# Patient Record
Sex: Female | Born: 1970 | Race: White | Hispanic: No | Marital: Married | Smoking: Never smoker
Health system: Southern US, Community
[De-identification: ages and names within clinical notes are randomized; demographics above are authoritative.]

## PROBLEM LIST (undated history)

## (undated) DIAGNOSIS — F419 Anxiety disorder, unspecified: Secondary | ICD-10-CM

## (undated) DIAGNOSIS — F32A Depression, unspecified: Secondary | ICD-10-CM

## (undated) DIAGNOSIS — K219 Gastro-esophageal reflux disease without esophagitis: Secondary | ICD-10-CM

## (undated) DIAGNOSIS — F329 Major depressive disorder, single episode, unspecified: Secondary | ICD-10-CM

## (undated) DIAGNOSIS — I10 Essential (primary) hypertension: Secondary | ICD-10-CM

## (undated) DIAGNOSIS — G2581 Restless legs syndrome: Secondary | ICD-10-CM

## (undated) DIAGNOSIS — G43909 Migraine, unspecified, not intractable, without status migrainosus: Secondary | ICD-10-CM

## (undated) DIAGNOSIS — N393 Stress incontinence (female) (male): Secondary | ICD-10-CM

## (undated) DIAGNOSIS — Z889 Allergy status to unspecified drugs, medicaments and biological substances status: Secondary | ICD-10-CM

## (undated) HISTORY — DX: Allergy status to unspecified drugs, medicaments and biological substances: Z88.9

## (undated) HISTORY — PX: HYSTEROSCOPY: SHX211

## (undated) HISTORY — DX: Depression, unspecified: F32.A

## (undated) HISTORY — DX: Stress incontinence (female) (male): N39.3

## (undated) HISTORY — DX: Major depressive disorder, single episode, unspecified: F32.9

## (undated) HISTORY — DX: Migraine, unspecified, not intractable, without status migrainosus: G43.909

---

## 1997-06-01 ENCOUNTER — Inpatient Hospital Stay (HOSPITAL_COMMUNITY): Admission: AD | Admit: 1997-06-01 | Discharge: 1997-06-04 | Payer: Self-pay | Admitting: Obstetrics and Gynecology

## 1997-06-02 ENCOUNTER — Encounter (HOSPITAL_COMMUNITY): Admission: RE | Admit: 1997-06-02 | Discharge: 1997-08-31 | Payer: Self-pay | Admitting: Obstetrics and Gynecology

## 1998-01-09 ENCOUNTER — Other Ambulatory Visit: Admission: RE | Admit: 1998-01-09 | Discharge: 1998-01-09 | Payer: Self-pay | Admitting: Obstetrics & Gynecology

## 1999-02-28 ENCOUNTER — Other Ambulatory Visit: Admission: RE | Admit: 1999-02-28 | Discharge: 1999-02-28 | Payer: Self-pay | Admitting: Obstetrics & Gynecology

## 2000-03-05 ENCOUNTER — Other Ambulatory Visit: Admission: RE | Admit: 2000-03-05 | Discharge: 2000-03-05 | Payer: Self-pay | Admitting: Obstetrics & Gynecology

## 2014-08-03 ENCOUNTER — Other Ambulatory Visit: Payer: Self-pay | Admitting: Family Medicine

## 2014-08-03 ENCOUNTER — Ambulatory Visit
Admission: RE | Admit: 2014-08-03 | Discharge: 2014-08-03 | Disposition: A | Discharge status: Home / Self Care | Payer: BC Managed Care – PPO | Source: Ambulatory Visit | Attending: Family Medicine | Admitting: Family Medicine

## 2014-08-03 DIAGNOSIS — R05 Cough: Secondary | ICD-10-CM

## 2014-08-03 DIAGNOSIS — R053 Chronic cough: Secondary | ICD-10-CM

## 2014-08-19 ENCOUNTER — Other Ambulatory Visit (HOSPITAL_COMMUNITY): Payer: Self-pay | Admitting: Respiratory Therapy

## 2014-08-29 ENCOUNTER — Encounter (HOSPITAL_COMMUNITY): Payer: BC Managed Care – PPO

## 2014-08-29 ENCOUNTER — Ambulatory Visit (HOSPITAL_COMMUNITY): Admission: RE | Admit: 2014-08-29 | Payer: BC Managed Care – PPO | Source: Ambulatory Visit

## 2014-08-29 ENCOUNTER — Other Ambulatory Visit (HOSPITAL_COMMUNITY): Payer: Self-pay | Admitting: Respiratory Therapy

## 2014-08-29 ENCOUNTER — Ambulatory Visit (HOSPITAL_COMMUNITY)
Admission: RE | Admit: 2014-08-29 | Discharge: 2014-08-29 | Disposition: A | Discharge status: Home / Self Care | Payer: BC Managed Care – PPO | Source: Ambulatory Visit | Attending: Family Medicine | Admitting: Family Medicine

## 2014-08-29 DIAGNOSIS — J45901 Unspecified asthma with (acute) exacerbation: Secondary | ICD-10-CM | POA: Insufficient documentation

## 2014-08-29 LAB — PULMONARY FUNCTION TEST
DL/VA % PRED: 120 %
DL/VA: 5.28 ml/min/mmHg/L
DLCO unc % pred: 104 %
DLCO unc: 21.2 ml/min/mmHg
FEF 25-75 POST: 3.15 L/s
FEF 25-75 PRE: 2.98 L/s
FEF2575-%CHANGE-POST: 5 %
FEF2575-%Pred-Post: 110 %
FEF2575-%Pred-Pre: 104 %
FEV1-%Change-Post: 0 %
FEV1-%Pred-Post: 96 %
FEV1-%Pred-Pre: 96 %
FEV1-PRE: 2.59 L
FEV1-Post: 2.59 L
FEV1FVC-%CHANGE-POST: 2 %
FEV1FVC-%PRED-PRE: 102 %
FEV6-%Change-Post: -2 %
FEV6-%PRED-PRE: 94 %
FEV6-%Pred-Post: 92 %
FEV6-PRE: 3.06 L
FEV6-Post: 2.99 L
FEV6FVC-%Change-Post: 0 %
FEV6FVC-%PRED-POST: 101 %
FEV6FVC-%Pred-Pre: 101 %
FVC-%Change-Post: -2 %
FVC-%PRED-PRE: 93 %
FVC-%Pred-Post: 90 %
FVC-Post: 3 L
FVC-Pre: 3.08 L
Post FEV1/FVC ratio: 86 %
Post FEV6/FVC ratio: 100 %
Pre FEV1/FVC ratio: 84 %
Pre FEV6/FVC Ratio: 99 %
RV % pred: 79 %
RV: 1.2 L
TLC % pred: 93 %
TLC: 4.3 L

## 2014-08-29 MED ORDER — ALBUTEROL SULFATE (2.5 MG/3ML) 0.083% IN NEBU
2.5000 mg | INHALATION_SOLUTION | Freq: Once | RESPIRATORY_TRACT | Status: AC
  Administered 2014-08-29: 2.5 mg via RESPIRATORY_TRACT

## 2014-09-19 ENCOUNTER — Other Ambulatory Visit: Payer: Self-pay | Admitting: *Deleted

## 2014-09-19 ENCOUNTER — Encounter: Payer: Self-pay | Admitting: *Deleted

## 2014-09-20 ENCOUNTER — Telehealth: Payer: Self-pay | Admitting: Pulmonary Disease

## 2014-09-20 ENCOUNTER — Encounter: Payer: Self-pay | Admitting: Pulmonary Disease

## 2014-09-20 ENCOUNTER — Other Ambulatory Visit: Payer: BC Managed Care – PPO

## 2014-09-20 ENCOUNTER — Ambulatory Visit (INDEPENDENT_AMBULATORY_CARE_PROVIDER_SITE_OTHER): Payer: BC Managed Care – PPO | Admitting: Pulmonary Disease

## 2014-09-20 ENCOUNTER — Encounter (INDEPENDENT_AMBULATORY_CARE_PROVIDER_SITE_OTHER): Payer: Self-pay

## 2014-09-20 VITALS — BP 116/82 | HR 115 | Ht 61.0 in | Wt 200.8 lb

## 2014-09-20 DIAGNOSIS — K219 Gastro-esophageal reflux disease without esophagitis: Secondary | ICD-10-CM

## 2014-09-20 DIAGNOSIS — J3089 Other allergic rhinitis: Secondary | ICD-10-CM | POA: Diagnosis not present

## 2014-09-20 DIAGNOSIS — Z889 Allergy status to unspecified drugs, medicaments and biological substances status: Secondary | ICD-10-CM

## 2014-09-20 DIAGNOSIS — R32 Unspecified urinary incontinence: Secondary | ICD-10-CM | POA: Insufficient documentation

## 2014-09-20 DIAGNOSIS — R05 Cough: Secondary | ICD-10-CM | POA: Diagnosis not present

## 2014-09-20 DIAGNOSIS — F32A Depression, unspecified: Secondary | ICD-10-CM | POA: Insufficient documentation

## 2014-09-20 DIAGNOSIS — G43909 Migraine, unspecified, not intractable, without status migrainosus: Secondary | ICD-10-CM | POA: Insufficient documentation

## 2014-09-20 DIAGNOSIS — R059 Cough, unspecified: Secondary | ICD-10-CM | POA: Insufficient documentation

## 2014-09-20 DIAGNOSIS — F419 Anxiety disorder, unspecified: Secondary | ICD-10-CM | POA: Insufficient documentation

## 2014-09-20 DIAGNOSIS — F329 Major depressive disorder, single episode, unspecified: Secondary | ICD-10-CM | POA: Insufficient documentation

## 2014-09-20 MED ORDER — RANITIDINE HCL 150 MG PO TABS: 150.0000 mg | ORAL_TABLET | Freq: Every day | ORAL | Status: DC

## 2014-09-20 MED ORDER — MONTELUKAST SODIUM 10 MG PO TABS: 10.0000 mg | ORAL_TABLET | Freq: Every day | ORAL | Status: DC

## 2014-09-20 NOTE — Patient Instructions (Addendum)
1. I'm checking a methacholine challenge test to determine if you truly have underlying asthma. 2. We are starting you on Singulair which will help with your allergies as well as potentially your cough. You can continue to take Zyrtec as you have been. 3. I'm starting you on Zantac 150 mg by mouth daily at bedtime. Please avoid eating within 2-3 hours of bedtime. After a couple of weeks you can try to come off of omeprazole. 4. We are checking a swallowing study to evaluate your reflux. 5. I'm checking blood work today for your allergies. 6. You can use NeilMed nasal saline rinse 1-2 times daily as directed on the box which should help with any post-nasal drainage from the dust. 7. I will see you back in 4-6 weeks but please feel free to contact my office if you have any further questions or concerns.

## 2014-09-20 NOTE — Progress Notes (Signed)
Subjective:    Patient ID: Nichole Clements, female    DOB: 1970/06/14, 44 y.o.   MRN: [id number]  HPI She reports as a child she had allergies to cats as well as seasonal allergies in the Spring & Fall.  She denies any recurrent bronchitis as a child. She reports she has always had a problem breathing with "heat blowing in her face".  She reports she has always routinely had a long duration of cough after a respiratory of sinus illness.  Usually her cough will last for a month.  She reports starting in May her oldest son had a respiratory infection that spread to her younger son.  Her youngest son also had a fever.  It wakes her up at night out of sleep.  She was initially seen at a local Urgent Care and was diagnosed with "bronchitis" and reportedly had wheezing.  She was started on an antibiotic, Prednisone, & ProAir inhaler for a week without any improvement in symptoms.  She saw her PCP in July and reportedly had "wheezing" on exam.  She was prescribed Advair 250/50 and after a couple of weeks returned to her office without any improvement in her coughing. She reports that with forced inhalation & exhalation during her pulmonary function testing her cough was incited.  She also reports that previously she had dyspnea & coughing on exertion.  She reports her cough has improved slightly since she quit taking Advair around that same time.  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 She feels a constant "tickle" in her throat. Questionable post-nasal drainage. She reports her cough seems to be coming back. She reports now her cough is intermittently productive of mucus but is unable to further characterize it.  She denies any audible wheezing recently. She reports previously she had dyspnea with her coughing spells.  She reports last year she started waking up at  night with "choking" that was attributed to reflux.  She was subsequently started on Omeprazole OTC which has helped. She reports she wakes up with morning brash water taste in her mouth about 3 mornings out of the week still. No abdominal pain or dyspepsia that is recurrently. No fever, chills, or sweats. She reports she has had pain in her wrists and hips that was evaluated for "rheumatoid arthritis" a year ago that was negative. Joint pains seem to be exacerbated by repetititive movements. No rashes but does bruise easily.   Review of Systems No dysuria or hematuria. No chest pain, pressure, or tightness. A pertinent 14 point review of systems is negative except as per the history of presenting illness.  No Known Allergies  Current Outpatient Prescriptions on File Prior to Visit  Medication Sig Dispense Refill  . buPROPion (BUDEPRION XL) 300 MG 24 hr tablet Take 300 mg by mouth daily.    . cetirizine (ZYRTEC) 10 MG chewable tablet Chew 10 mg by mouth daily.    . rizatriptan (MAXALT) 10 MG tablet Take 10 mg by mouth as needed.    . topiramate (TOPAMAX) 100 MG tablet Take 100 mg by mouth 2 (two) times daily.     No current facility-administered medications on file prior to visit.   Past Medical History  Diagnosis Date  . Migraines   . Depression   . H/O seasonal allergies   . Stress  incontinence    Past Surgical History  Procedure Laterality Date  . Hysteroscopy     Family History  Problem Relation Age of Onset  . Hypothyroidism Father   . Hypertension Father   . Migraines Mother   . Hypertension Mother   . Emphysema Maternal Grandmother   . Heart disease Maternal Grandmother   . Emphysema Maternal Uncle   . Asthma Maternal Uncle   . Asthma Paternal Grandmother   . Heart disease Paternal Grandfather   . Ovarian cancer Other    Social History   Social History  . Marital Status: Married    Spouse Name: N/A  . Number of Children: 2  . Years of Education: N/A    Occupational History  . teacher/media specialists    Social History Main Topics  . Smoking status: Never Smoker   . Smokeless tobacco: None     Comment: Significant second-hand exposure through her mother primarily.  . Alcohol Use: 0.0 oz/week    0 Standard drinks or equivalent per week     Comment: Once a month.   . Drug Use: No  . Sexual Activity: Not Asked   Other Topics Concern  . None   Social History Narrative   Originally from [place]. She has always lived in [place]. She has prior travel to [place] in [place], [place] on cruise in [place], [place] last in 2012, [place], [place], [place], [place], [place], [place] & [place]. 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 Has a cat & dogs currently. Remote exposure to finches as a child. No known mold exposure in her home or at work. Has an above-ground pool. No hot tub exposure. She reads & watches movies for fun. 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       Objective:   Physical Exam Blood pressure 116/82, pulse 115, height  (1.549 m), weight 200 lb 12.8 oz (91.082 kg), SpO2 98 %. General:  Awake. Alert. No acute distress. Central obesity.  Integument:  Warm & dry. No rash on exposed skin. No bruising. Tattoo noted on upper back. Lymphatics:  No appreciated cervical or supraclavicular lymphadenoapthy. HEENT:  Moist mucus membranes. No oral ulcers. No scleral injection or icterus. No nasal turbinate swelling. PERRL. Cardiovascular:  Regular rate. No edema. No appreciable JVD.  Pulmonary:  Good aeration & clear to auscultation bilaterally. Symmetric chest wall expansion. No accessory muscle use. Abdomen: Soft. Normal bowel sounds. Nondistended. Grossly nontender. Musculoskeletal:  Normal bulk and tone. Hand grip strength 5/5 bilaterally. No joint deformity or effusion appreciated. Neurological:  CN 2-12 grossly in tact. No meningismus. Moving all 4 extremities  equally. Symmetric patellar deep tendon reflexes. Psychiatric:  Mood and affect congruent. Speech normal rhythm, rate & tone.   PFT 08/29/14: FVC 3.08 L (93%) FEV1 2.59 L (96%) FEV1/FVC 0.84 FEF 25-75 2.98 L (104%) no bronchodilator response TLC 4.30 L (93%) RV 79% ERV 42% DLCO uncorrected 104%  IMAGING CXR PA/LAT 08/03/14 (personally reviewed by me): No nodule or opacity appreciated. No pleural effusion. Heart normal in size. Mediastinum normal in contour.    Assessment & Plan:  Patient is a 44 year old female with previously resolving cough which has recurred after recent exposure to dust at her place of work/school. From the patient's symptoms it does sound as though she is having uncontrolled laryngo-esophageal reflux despite using omeprazole on daily basis. She does have a history of seasonal allergic rhinitis as  well as sensitivity to inhaled perfumes which would suggest the possibility of underlying asthma. Her spirometry, lung volumes, and carbon monoxide diffusion opacity are all normal which can still be seen in the setting of asthma. Additionally, I personally reviewed her chest x-ray which shows no parenchymal abnormalities that would account for her ongoing cough. I instructed the patient to notify my office if she developed any new symptoms before her next appointment.  1. Cough: Likely secondary to underlying asthma & uncontrolled GERD. Increasing gastric acid suppression with Zantac. Checking methacholine challenge test. Starting patient on Singulair 10 mg by mouth daily at bedtime & continuing ProAir her inhaler as needed. 2. GERD: Checking barium swallow. Starting Zantac 150 mg by mouth daily at bedtime. Instructed patient to consider tapering off of omeprazole in a couple of weeks if symptoms are better controlled. 3. History of seasonal allergies: Checking serum RAST panel. 4. Allergic rhinitis: Recommended nasal saline rinse 1-2 times daily. Checking serum RAST panel. Continuing  Zyrtec. Starting Singulair. 5. Follow-up: Patient to return to clinic in 4-6 weeks or sooner if needed.

## 2014-09-20 NOTE — Telephone Encounter (Signed)
PFT 08/29/14: FVC 3.08 L (93%) FEV1 2.59 L (96%) FEV1/FVC 0.84 FEF 25-75 2.98 L (104%) no bronchodilator response TLC 4.30 L (93%) RV 79% ERV 42% DLCO uncorrected 104%  IMAGING CXR PA/LAT 08/03/14 (personally reviewed by me): No nodule or opacity appreciated. No pleural effusion. Heart normal in size. Mediastinum normal in contour.

## 2014-09-21 LAB — ALLERGY FULL PROFILE
Alternaria Alternata: <0.1 kU/L
Candida Albicans: <0.1 kU/L
Cat Dander: <0.1 kU/L
Curvularia lunata: <0.1 kU/L
Elm IgE: <0.1 kU/L
G005 Rye, Perennial: <0.1 kU/L
Goldenrod: <0.1 kU/L
Helminthosporium halodes: <0.1 kU/L
IGE (IMMUNOGLOBULIN E), SERUM: 9 kU/L (ref <115)
Lamb's Quarters: <0.1 kU/L
Plantain: <0.1 kU/L
Stemphylium Botryosum: <0.1 kU/L
Timothy Grass: <0.1 kU/L

## 2014-09-23 ENCOUNTER — Ambulatory Visit (HOSPITAL_COMMUNITY)
Admission: RE | Admit: 2014-09-23 | Discharge: 2014-09-23 | Disposition: A | Discharge status: Home / Self Care | Payer: BC Managed Care – PPO | Source: Ambulatory Visit | Attending: Pulmonary Disease | Admitting: Pulmonary Disease

## 2014-09-23 DIAGNOSIS — K219 Gastro-esophageal reflux disease without esophagitis: Secondary | ICD-10-CM

## 2014-09-23 DIAGNOSIS — R05 Cough: Secondary | ICD-10-CM

## 2014-09-23 DIAGNOSIS — R059 Cough, unspecified: Secondary | ICD-10-CM

## 2014-09-23 LAB — PULMONARY FUNCTION TEST
FEF 25-75 PRE: 3.12 L/s
FEF 25-75 Post: 3.12 L/sec
FEF2575-%CHANGE-POST: 0 %
FEF2575-%PRED-PRE: 109 %
FEF2575-%Pred-Post: 109 %
FEV1-%CHANGE-POST: 0 %
FEV1-%PRED-POST: 95 %
FEV1-%PRED-PRE: 95 %
FEV1-PRE: 2.57 L
FEV1-Post: 2.55 L
FEV1FVC-%Change-Post: 0 %
FEV1FVC-%Pred-Pre: 105 %
FEV6-%CHANGE-POST: 0 %
FEV6-%PRED-POST: 91 %
FEV6-%PRED-PRE: 92 %
FEV6-PRE: 2.98 L
FEV6-Post: 2.97 L
FEV6FVC-%PRED-PRE: 102 %
FEV6FVC-%Pred-Post: 102 %
FVC-%CHANGE-POST: 0 %
FVC-%Pred-Post: 90 %
FVC-%Pred-Pre: 90 %
FVC-POST: 2.97 L
FVC-Pre: 2.98 L
POST FEV1/FVC RATIO: 86 %
POST FEV6/FVC RATIO: 100 %
PRE FEV6/FVC RATIO: 100 %
Pre FEV1/FVC ratio: 86 %

## 2014-09-23 MED ORDER — METHACHOLINE 4 MG/ML NEB SOLN
2.0000 mL | Freq: Once | RESPIRATORY_TRACT | Status: AC
  Administered 2014-09-23: 8 mg via RESPIRATORY_TRACT

## 2014-09-23 MED ORDER — ALBUTEROL SULFATE (2.5 MG/3ML) 0.083% IN NEBU
2.5000 mg | INHALATION_SOLUTION | Freq: Once | RESPIRATORY_TRACT | Status: AC
  Administered 2014-09-23: 2.5 mg via RESPIRATORY_TRACT

## 2014-09-23 MED ORDER — METHACHOLINE 16 MG/ML NEB SOLN
2.0000 mL | Freq: Once | RESPIRATORY_TRACT | Status: AC
  Administered 2014-09-23: 32 mg via RESPIRATORY_TRACT

## 2014-09-23 MED ORDER — METHACHOLINE 0.0625 MG/ML NEB SOLN
2.0000 mL | Freq: Once | RESPIRATORY_TRACT | Status: AC
  Administered 2014-09-23: 0.125 mg via RESPIRATORY_TRACT

## 2014-09-23 MED ORDER — SODIUM CHLORIDE 0.9 % IN NEBU
3.0000 mL | INHALATION_SOLUTION | Freq: Once | RESPIRATORY_TRACT | Status: AC
  Administered 2014-09-23: 3 mL via RESPIRATORY_TRACT

## 2014-09-23 MED ORDER — METHACHOLINE 1 MG/ML NEB SOLN
2.0000 mL | Freq: Once | RESPIRATORY_TRACT | Status: AC
  Administered 2014-09-23: 2 mg via RESPIRATORY_TRACT

## 2014-09-23 MED ORDER — METHACHOLINE 0.25 MG/ML NEB SOLN
2.0000 mL | Freq: Once | RESPIRATORY_TRACT | Status: AC
  Administered 2014-09-23: 0.5 mg via RESPIRATORY_TRACT

## 2014-10-04 ENCOUNTER — Telehealth: Payer: Self-pay | Admitting: Pulmonary Disease

## 2014-10-04 DIAGNOSIS — R059 Cough, unspecified: Secondary | ICD-10-CM

## 2014-10-04 DIAGNOSIS — R05 Cough: Secondary | ICD-10-CM

## 2014-10-04 NOTE — Telephone Encounter (Signed)
Pt states she took an Omeprazole today and stopped the Zantac. States the Zantac has  Not helped. Wants to know what suggestions you have for her.

## 2014-10-04 NOTE — Telephone Encounter (Signed)
Pt returned call [phone]

## 2014-10-04 NOTE — Telephone Encounter (Signed)
Spoke with patient-states she tried the step therapy from [facility]-take Zantac with Prilosec together for 1 week and then come off the Prilosec; pt had severe increase in heartburn with anything she ate. Pt would like to know results if Metho Challenge Test and what the next step in treatment plan will be. She had a rough weekend.   [facility]N please advise. Thanks.

## 2014-10-04 NOTE — Telephone Encounter (Signed)
LMOM for pt to return call. 

## 2014-10-04 NOTE — Telephone Encounter (Signed)
Spoke with the pt and notified of results and recs per Dr Jamison Neighbor She verbalized understanding and denied any further questions  Referral to ENT was made

## 2014-10-04 NOTE — Telephone Encounter (Signed)
Please inform her of the results of her barium swallow & serum allergy testing. Both of these were normal. Her methacholine challenge doesn't suggest asthma. She should continue taking both the Zantac & Prilosec. I would recommend a refer to ENT for evaluation. I don't see where I documented that she's been evaluated with direct laryngoscopy which would be the next step. Thanks.

## 2014-10-05 ENCOUNTER — Telehealth: Payer: Self-pay | Admitting: Internal Medicine

## 2014-10-05 NOTE — Telephone Encounter (Signed)
This is the 2nd time that I have tried to contact the patient. I am trying to schedule ENT appt but wanted to know if she had a preference to location, [place], [place], or [place] since she lives there

## 2014-10-06 NOTE — Telephone Encounter (Signed)
Cancelled the referral order per [facility]

## 2014-10-06 NOTE — Telephone Encounter (Signed)
I called spoke with pt. She does not want to see ENT and wants [place] to cancel the referral. She reports if she feels she needs this referral then she will call back. FYI to Dr. Jamison Neighbor and PCC's.

## 2014-10-18 ENCOUNTER — Ambulatory Visit: Payer: BC Managed Care – PPO | Admitting: Pulmonary Disease

## 2016-08-21 ENCOUNTER — Other Ambulatory Visit (HOSPITAL_COMMUNITY)
Admission: RE | Admit: 2016-08-21 | Discharge: 2016-08-21 | Disposition: A | Discharge status: Home / Self Care | Payer: BC Managed Care – PPO | Source: Ambulatory Visit | Attending: Family Medicine | Admitting: Family Medicine

## 2016-08-21 ENCOUNTER — Other Ambulatory Visit: Payer: Self-pay | Admitting: Family Medicine

## 2016-08-21 DIAGNOSIS — Z124 Encounter for screening for malignant neoplasm of cervix: Secondary | ICD-10-CM | POA: Diagnosis present

## 2016-08-24 LAB — CYTOLOGY - PAP: Diagnosis: NEGATIVE

## 2017-03-09 IMAGING — CR DG CHEST 2V
2 series · 2 of 2 positions shown · non-contrast
Comparison: None.

CLINICAL DATA: Chronic cough

EXAM:
CHEST  2 VIEW

[w chest pa]
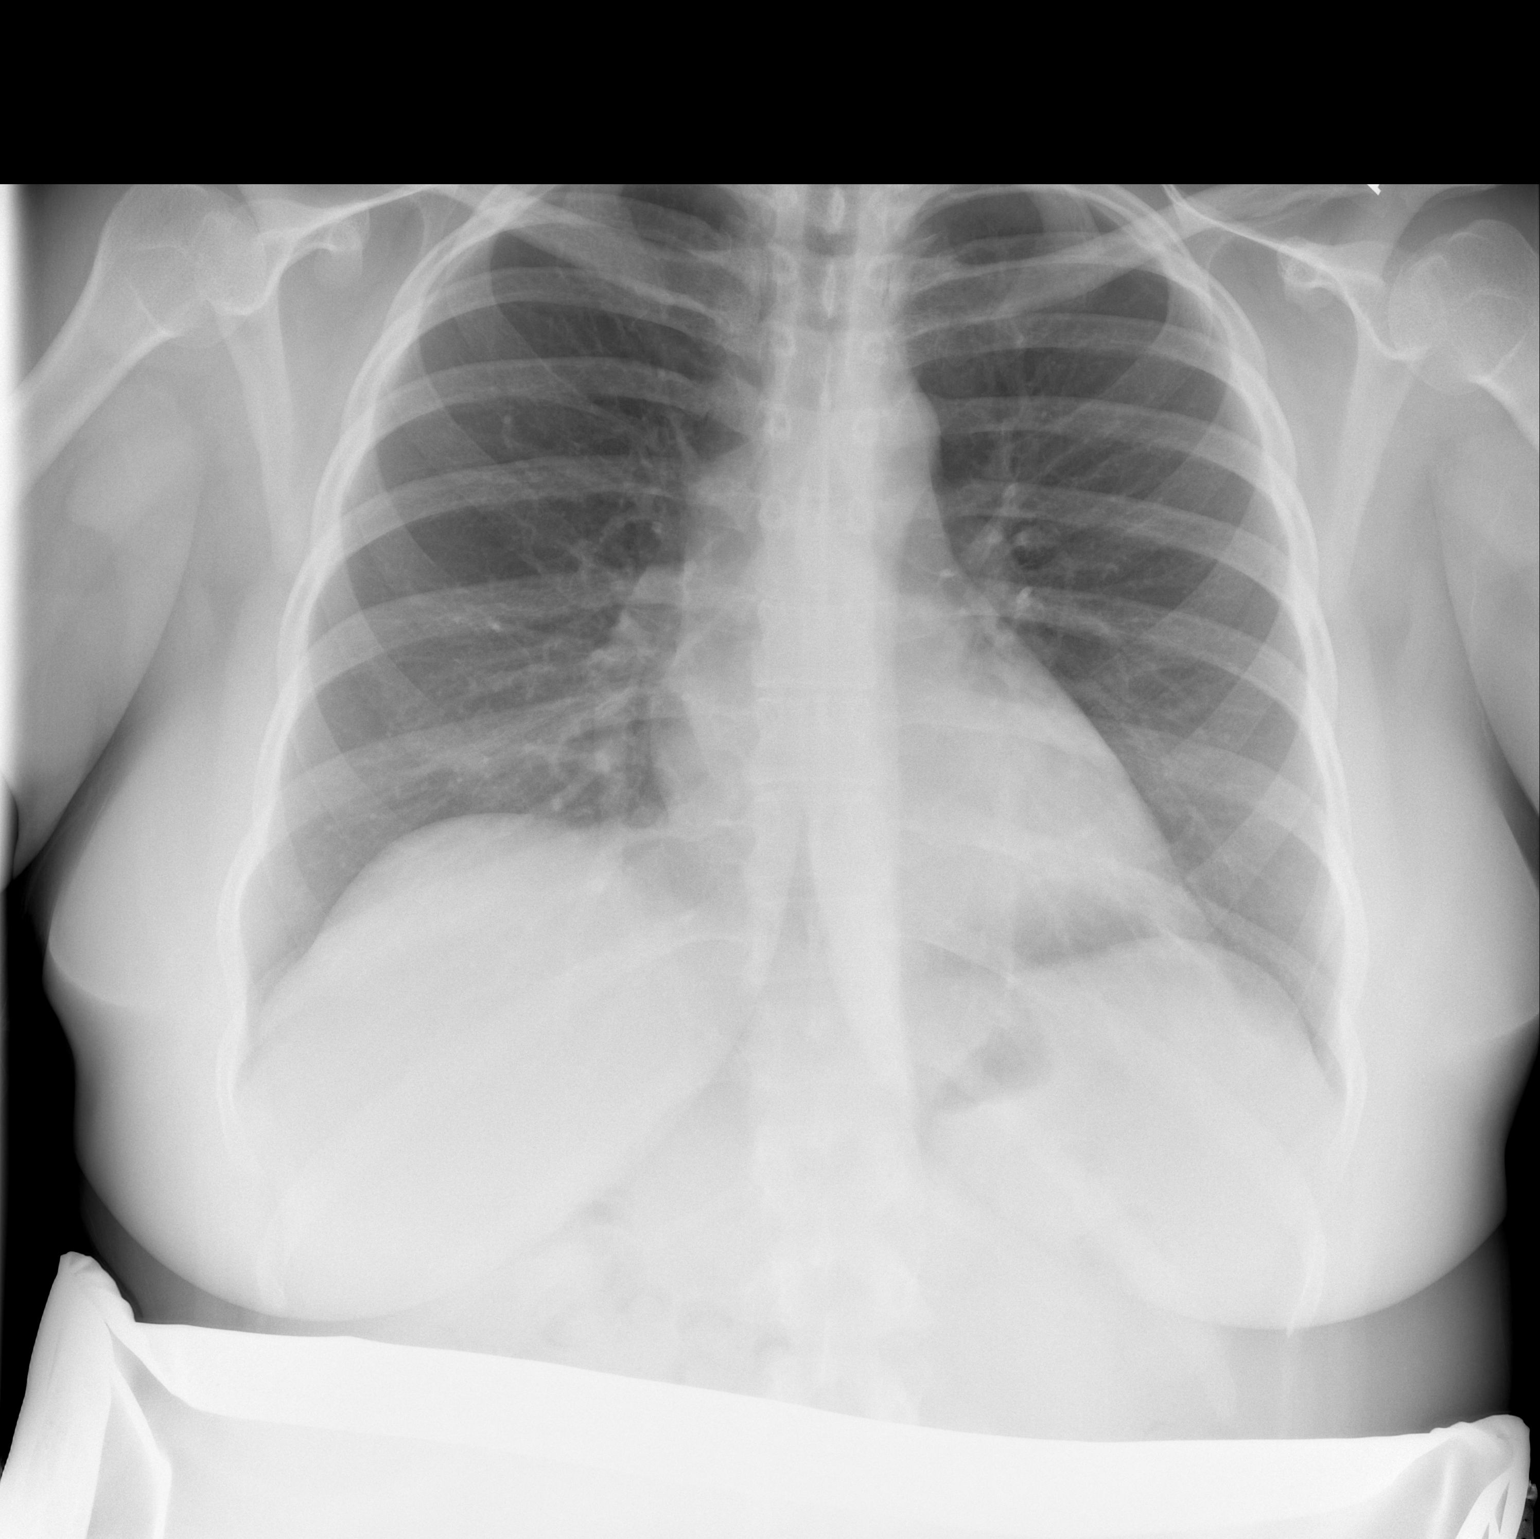

[w chest lat]
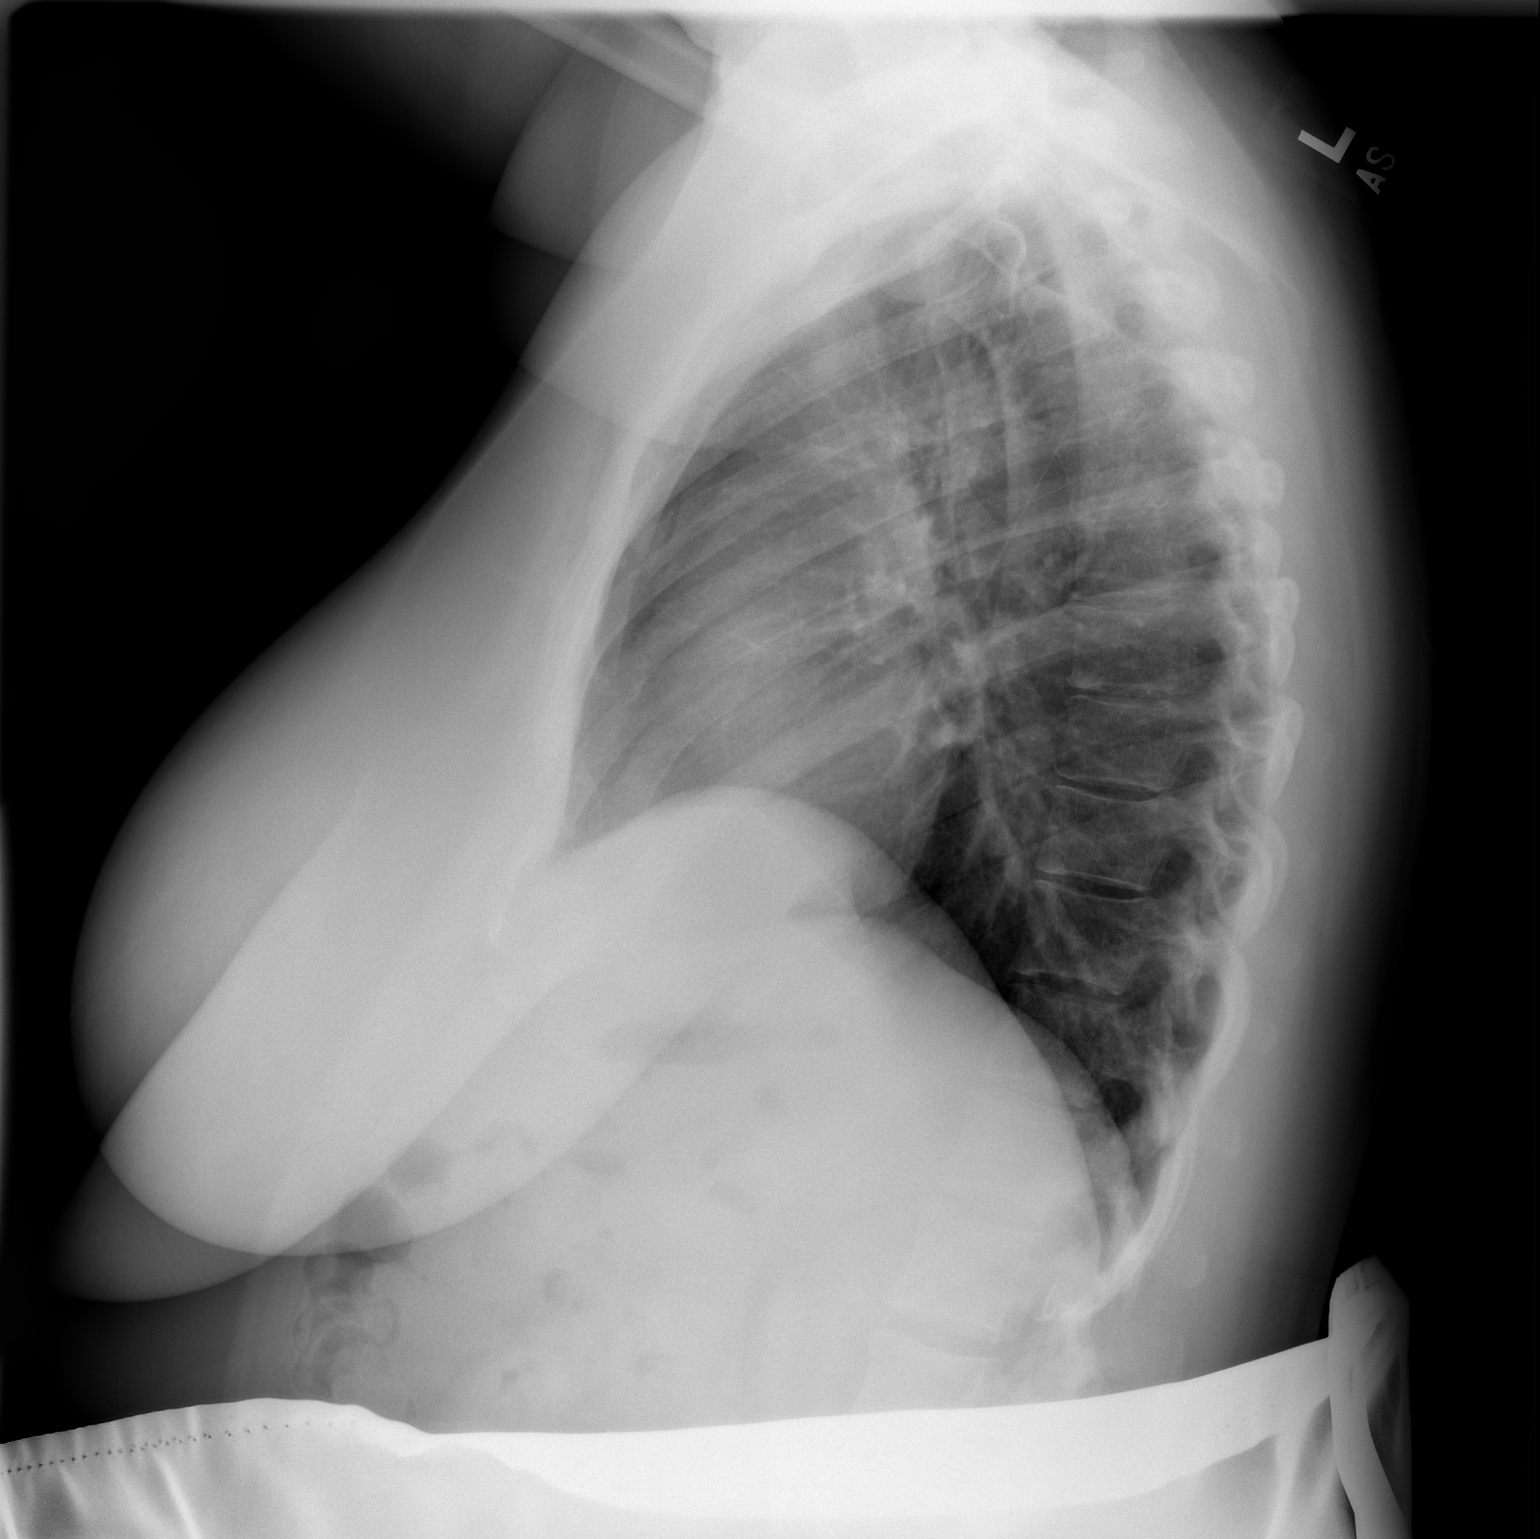

[2 of 2 positions shown; findings below may reference images not displayed]

FINDINGS: No active infiltrate or effusion is seen. Mediastinal and hilar
contours are unremarkable. The heart is within normal limits in
size. No bony abnormality is seen.
IMPRESSION: No active cardiopulmonary disease.

## 2019-09-13 ENCOUNTER — Other Ambulatory Visit: Payer: Self-pay | Admitting: Family Medicine

## 2019-09-13 ENCOUNTER — Other Ambulatory Visit (HOSPITAL_COMMUNITY)
Admission: RE | Admit: 2019-09-13 | Discharge: 2019-09-13 | Disposition: A | Discharge status: Home / Self Care | Payer: BC Managed Care – PPO | Source: Ambulatory Visit | Attending: Family Medicine | Admitting: Family Medicine

## 2019-09-13 DIAGNOSIS — Z124 Encounter for screening for malignant neoplasm of cervix: Secondary | ICD-10-CM | POA: Insufficient documentation

## 2019-09-16 LAB — CYTOLOGY - PAP
Diagnosis: NEGATIVE
Diagnosis: REACTIVE

## 2022-05-28 ENCOUNTER — Encounter: Payer: Self-pay | Admitting: Plastic Surgery

## 2022-05-28 ENCOUNTER — Ambulatory Visit: Payer: BC Managed Care – PPO | Admitting: Plastic Surgery

## 2022-05-28 VITALS — BP 123/85 | HR 83 | Ht 61.0 in | Wt 193.4 lb

## 2022-05-28 DIAGNOSIS — F419 Anxiety disorder, unspecified: Secondary | ICD-10-CM

## 2022-05-28 DIAGNOSIS — G8929 Other chronic pain: Secondary | ICD-10-CM

## 2022-05-28 DIAGNOSIS — N62 Hypertrophy of breast: Secondary | ICD-10-CM | POA: Diagnosis not present

## 2022-05-28 DIAGNOSIS — M546 Pain in thoracic spine: Secondary | ICD-10-CM

## 2022-05-28 DIAGNOSIS — Z803 Family history of malignant neoplasm of breast: Secondary | ICD-10-CM

## 2022-05-28 DIAGNOSIS — R21 Rash and other nonspecific skin eruption: Secondary | ICD-10-CM

## 2022-05-28 DIAGNOSIS — M542 Cervicalgia: Secondary | ICD-10-CM

## 2022-05-28 DIAGNOSIS — Z6836 Body mass index (BMI) 36.0-36.9, adult: Secondary | ICD-10-CM

## 2022-05-28 DIAGNOSIS — M549 Dorsalgia, unspecified: Secondary | ICD-10-CM | POA: Insufficient documentation

## 2022-05-28 DIAGNOSIS — M545 Low back pain, unspecified: Secondary | ICD-10-CM

## 2022-05-28 NOTE — Progress Notes (Addendum)
Patient ID: Nichole Clements, female    DOB: 16-Feb-1970, 52 y.o.   MRN: [id number]   Chief Complaint  Patient presents with   Advice Only   Breast Problem    Mammary Hyperplasia: The patient is a 52 y.o. female with a history of mammary hyperplasia for several years.  She has extremely large breasts causing symptoms that include the following: Back pain in the upper and lower back, including neck pain. She pulls or pins her bra straps to provide better lift and relief of the pressure and pain. She notices relief by holding her breast up manually.  Her shoulder straps cause grooves and pain and pressure that requires padding for relief. Pain medication is sometimes required with motrin and tylenol.  Activities that are hindered by enlarged breasts include: exercise and running.  She has tried supportive clothing as well as fitted bras without improvement.  Her breasts are extremely large and fairly symmetric.  She has hyperpigmentation of the inframammary area on both sides.  The sternal to nipple distance on the right is 32 cm and the left is 33 cm.  The IMF distance is 16 cm.  She is 5 feet 1 inches tall and weighs 193 pounds.  The BMI = 36.5 kg/m.  Preoperative bra size = F or G cup.  The estimated excess breast tissue to be removed at the time of surgery = 575 grams on the left and 575 grams on the right.  Mammogram history: January 2024 negative.  Family history of breast cancer:  sister.  Tobacco use:  none.   The patient expresses the desire to pursue surgical intervention.  The patient was seen by orthopedic surgery for the possibility of back surgery.  They felt that the pain was contributed to the size of the breast and she was referred to [place] for evaluation for breast reduction.  She finished 2 rounds of physical therapy without improvement in her back pain.  She also has lot of asymmetry with a tilt to the left while sitting and standing as seen by her shoulder height.     Review of  Systems  Constitutional:  Positive for activity change. Negative for appetite change.  HENT: Negative.    Eyes: Negative.   Respiratory: Negative.  Negative for cough and chest tightness.   Cardiovascular: Negative.   Endocrine: Negative.   Genitourinary: Negative.   Musculoskeletal:  Positive for neck pain.  Skin:  Positive for rash.    Past Medical History:  Diagnosis Date   Depression    H/O seasonal allergies    Migraines    Stress incontinence     Past Surgical History:  Procedure Laterality Date   HYSTEROSCOPY        Current Outpatient Medications:    albuterol (PROVENTIL HFA;VENTOLIN HFA) 108 (90 BASE) MCG/ACT inhaler, Inhale 2 puffs into the lungs every 6 (six) hours as needed for wheezing or shortness of breath., Disp: , Rfl:    atorvastatin (LIPITOR) 20 MG tablet, Take 20 mg by mouth daily., Disp: , Rfl:    buPROPion (WELLBUTRIN XL) 150 MG 24 hr tablet, Take 150 mg by mouth every morning., Disp: , Rfl:    carvedilol (COREG) 6.25 MG tablet, Take 6.25 mg by mouth 2 (two) times daily., Disp: , Rfl:    cetirizine (ZYRTEC) 10 MG chewable tablet, Chew 10 mg by mouth daily., Disp: , Rfl:    clonazePAM (KLONOPIN) 0.5 MG tablet, Take 0.5 mg by mouth at bedtime., Disp: ,  Rfl:    fluticasone (FLONASE) 50 MCG/ACT nasal spray, two sprays by Both Nostrils route daily., Disp: , Rfl:    omeprazole (PRILOSEC) 20 MG capsule, Take by mouth., Disp: , Rfl:    phentermine (ADIPEX-P) 37.5 MG tablet, Take 18.75 mg by mouth every morning., Disp: , Rfl:    rizatriptan (MAXALT) 10 MG tablet, Take 10 mg by mouth as needed., Disp: , Rfl:    sertraline (ZOLOFT) 100 MG tablet, Take 100 mg by mouth daily., Disp: , Rfl:    topiramate (TOPAMAX) 100 MG tablet, Take 100 mg by mouth 2 (two) times daily., Disp: , Rfl:    Objective:   Vitals:   05/28/22 1323  BP: 123/85  Pulse: 83  SpO2: 97%    Physical Exam Vitals and nursing note reviewed.  Constitutional:      Appearance: Normal appearance.   HENT:     Head: Normocephalic and atraumatic.  Cardiovascular:     Rate and Rhythm: Normal rate.     Pulses: Normal pulses.  Pulmonary:     Effort: Pulmonary effort is normal.  Abdominal:     General: There is no distension.     Palpations: Abdomen is soft.     Tenderness: There is no abdominal tenderness.  Musculoskeletal:        General: No swelling or deformity.  Skin:    General: Skin is warm.     Capillary Refill: Capillary refill takes less than 2 seconds.     Coloration: Skin is not jaundiced.     Findings: No bruising or lesion.  Neurological:     Mental Status: She is alert and oriented to person, place, and time.  Psychiatric:        Mood and Affect: Mood normal.        Behavior: Behavior normal.        Thought Content: Thought content normal.        Judgment: Judgment normal.     Assessment & Plan:  Anxiety disorder, unspecified type  Chronic bilateral thoracic back pain  Symptomatic mammary hypertrophy  The procedure the patient selected and that was best for the patient was discussed. The risk were discussed and include but not limited to the following:  Breast asymmetry, fluid accumulation, firmness of the breast, inability to breast feed, loss of nipple or areola, skin loss, change in skin and nipple sensation, fat necrosis of the breast tissue, bleeding, infection and healing delay.  There are risks of anesthesia and injury to nerves or blood vessels.  Allergic reaction to tape, suture and skin glue are possible.  There will be swelling.  Any of these can lead to the need for revisional surgery which is not included in this surgery.  A breast reduction has potential to interfere with diagnostic procedures in the future.  This procedure is best done when the breast is fully developed.  Changes in the breast will continue to occur over time: pregnancy, weight gain or weigh loss. No guarantees are given for a certain bra or breast size.    Total time: 40 minutes.  This includes time spent with the patient during the visit as well as time spent before and after the visit reviewing the chart, documenting the encounter, ordering pertinent studies and literature for the patient.   Physical therapy: Completed Mammogram: January 2024 Healthy Weight and Wellness: Currently seeing a nutritionist  Patient is a good candidate for bilateral breast reduction with possible liposuction.  She is hoping to lose a  little bit of weight between now and the surgery and think she will be ready in the next 2 to 3 months.  Pictures were obtained of the patient and placed in the chart with the patient's or guardian's permission.   Alena Bills Liz Pinho, DO

## 2022-06-01 ENCOUNTER — Encounter: Payer: Self-pay | Admitting: *Deleted

## 2022-07-02 ENCOUNTER — Telehealth: Payer: Self-pay | Admitting: Plastic Surgery

## 2022-07-02 NOTE — Telephone Encounter (Signed)
Pending Ref# [id number] for BIL Br Reduction CPT code [phone]

## 2022-07-15 ENCOUNTER — Ambulatory Visit: Payer: BC Managed Care – PPO

## 2022-07-15 ENCOUNTER — Telehealth: Payer: Self-pay | Admitting: *Deleted

## 2022-07-15 NOTE — Telephone Encounter (Signed)
LVM about denial.

## 2022-07-16 ENCOUNTER — Telehealth: Payer: Self-pay | Admitting: *Deleted

## 2022-07-16 NOTE — Telephone Encounter (Signed)
Sent patient mychart message sent about denial after patient left vague vm in response to my detailed vm about [facility] denial due to lack of PT or Chiro notes documenting neck/back pain eval and conservative treatment.

## 2022-10-18 ENCOUNTER — Encounter: Payer: Self-pay | Admitting: Plastic Surgery

## 2022-10-29 ENCOUNTER — Telehealth: Payer: Self-pay | Admitting: *Deleted

## 2022-10-29 NOTE — Telephone Encounter (Signed)
This message was sent via FAXCOM, a product from [employer]. [email or web address]/                    -------Fax Transmission Report-------  To:               Recipient at [phone] Subject:          Secure - reconsideration request for case [id number] - [place] Result:           The transmission was successful. Explanation:      All Pages Ok Pages Sent:       17 Connect Time:     14 minutes, 48 seconds Transmit Time:    10/29/2022 08:37 Transfer Rate:    14400 Status Code:      0000 Retry Count:      0 Job Id:           [id number] Unique Id:        [id number] Fax Line:         51 Fax Server:       [id number]

## 2022-10-29 NOTE — Telephone Encounter (Signed)
Reconsideration faxed to [phone]:

## 2022-11-14 ENCOUNTER — Encounter: Payer: Self-pay | Admitting: Surgical

## 2022-11-14 ENCOUNTER — Ambulatory Visit: Payer: BC Managed Care – PPO | Admitting: Surgical

## 2022-11-14 VITALS — BP 145/84 | HR 88

## 2022-11-14 DIAGNOSIS — N62 Hypertrophy of breast: Secondary | ICD-10-CM

## 2022-11-14 DIAGNOSIS — M546 Pain in thoracic spine: Secondary | ICD-10-CM

## 2022-11-14 DIAGNOSIS — G8929 Other chronic pain: Secondary | ICD-10-CM

## 2022-11-14 MED ORDER — ONDANSETRON HCL 4 MG PO TABS: 4.0000 mg | ORAL_TABLET | Freq: Three times a day (TID) | ORAL | 0 refills | Status: AC | PRN

## 2022-11-14 MED ORDER — OXYCODONE HCL 5 MG PO TABS: 5.0000 mg | ORAL_TABLET | Freq: Four times a day (QID) | ORAL | 0 refills | Status: AC | PRN

## 2022-11-14 MED ORDER — CEPHALEXIN 500 MG PO CAPS: 500.0000 mg | ORAL_CAPSULE | Freq: Four times a day (QID) | ORAL | 0 refills | Status: AC

## 2022-11-14 NOTE — Progress Notes (Signed)
Patient ID: Nichole Clements, female    DOB: 09/23/1970, 52 y.o.   MRN: [id number]  Chief Complaint  Patient presents with   Pre-op Exam      ICD-10-CM   1. Chronic bilateral thoracic back pain  M54.6    G89.29     2. Symptomatic mammary hypertrophy  N62        History of Present Illness: Nichole Clements is a 52 y.o.  female  with a history of macromastia.  She presents for preoperative evaluation for upcoming procedure, Bilateral Breast Reduction with possible liposuction, scheduled for 12/05/2022 with Dr.  Ulice Bold  The patient has not had problems with anesthesia. No history of DVT/PE.  No family history of DVT/PE.  No family or personal history of bleeding or clotting disorders.  Patient is not currently taking any blood thinners.  No history of CVA/MI.   She does have a history of asthma, well-controlled.  Reports it is seasonal.  She has not had any exacerbations recently.  Summary of Previous Visit: STN 33 cm left, 32 cm right.  BMI is 36.5.  Preoperative bra size is a F or G cup.  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  PMH Significant for: Macromastia, asthma, seasonal allergies  Patient presents today with her husband.  She is feeling well.  No significant changes to her health recently, she has been working on weight loss and diet.  She reports she would like to be about a C/D cup.   She denies any cardiac or pulmonary symptoms.  No cardiac disease.  Past Medical History: Allergies: No Known Allergies  Current Medications:  Current Outpatient Medications:    albuterol (PROVENTIL HFA;VENTOLIN HFA) 108 (90 BASE) MCG/ACT inhaler, Inhale 2 puffs into the lungs every 6 (six) hours as needed for wheezing or shortness of breath., Disp: , Rfl:    atorvastatin (LIPITOR) 20 MG tablet, Take 20 mg by mouth daily., Disp: , Rfl:    buPROPion (WELLBUTRIN XL) 150  MG 24 hr tablet, Take 150 mg by mouth every morning., Disp: , Rfl:    carvedilol (COREG) 6.25 MG tablet, Take 6.25 mg by mouth 2 (two) times daily., Disp: , Rfl:    cephALEXin (KEFLEX) 500 MG capsule, Take 1 capsule (500 mg total) by mouth 4 (four) times daily for 3 days., Disp: 12 capsule, Rfl: 0   cetirizine (ZYRTEC) 10 MG chewable tablet, Chew 10 mg by mouth daily., Disp: , Rfl:    clonazePAM (KLONOPIN) 0.5 MG tablet, Take 0.5 mg by mouth at bedtime., Disp: , Rfl:    fluticasone (FLONASE) 50 MCG/ACT nasal spray, two sprays by Both Nostrils route daily., Disp: , Rfl:    omeprazole (PRILOSEC) 20 MG capsule, Take by mouth., Disp: , Rfl:    ondansetron (ZOFRAN) 4 MG tablet, Take 1 tablet (4 mg total) by mouth every 8 (eight) hours as needed for nausea or vomiting., Disp: 20 tablet, Rfl: 0   oxyCODONE (OXY IR/ROXICODONE) 5 MG immediate release tablet, Take 1 tablet (5 mg total) by mouth every 6 (six) hours as needed for up to 5 days for severe pain (pain score 7-10)., Disp: 20 tablet, Rfl: 0   rizatriptan (MAXALT) 10 MG tablet, Take 10 mg by mouth as needed., Disp: , Rfl:    sertraline (ZOLOFT) 100 MG tablet, Take 100 mg by mouth daily., Disp: , Rfl:  topiramate (TOPAMAX) 100 MG tablet, Take 100 mg by mouth 2 (two) times daily., Disp: , Rfl:    VEOZAH 45 MG TABS, Take 45 mg by mouth daily. Orally once a day for 30 days, Disp: , Rfl:   Past Medical Problems: Past Medical History:  Diagnosis Date   Depression    H/O seasonal allergies    Migraines    Stress incontinence     Past Surgical History: Past Surgical History:  Procedure Laterality Date   HYSTEROSCOPY      Social History: Social History   Socioeconomic History   Marital status: Married    Spouse name: Not on file   Number of children: 2   Years of education: Not on file   Highest education level: Not on file  Occupational History   Occupation: teacher/media specialists  Tobacco Use   Smoking status: Never   Smokeless  tobacco: Not on file   Tobacco comments:    Significant second-hand exposure through her mother primarily.  Substance and Sexual Activity   Alcohol use: Yes    Alcohol/week: 0.0 standard drinks of alcohol    Comment: Once a month.    Drug use: No   Sexual activity: Not on file  Other Topics Concern   Not on file  Social History Narrative   Originally from [facility]. She has always lived in [place]. She has prior travel to [place], [place] on cruise in [place], [place] last in 2012, [place], [place], [place], [place], [place], [place] & [place]. 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 Has a cat & dogs currently. Remote exposure to finches as a child. No known mold exposure in her home or at work. Has an above-ground pool. No hot tub exposure. She reads & watches movies for fun. 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    Social Determinants of Health   Financial Resource Strain: Not on file  Food Insecurity: Not on file  Transportation Needs: Not on file  Physical Activity: Not on file  Stress: Not on file  Social Connections: Unknown (06/10/2021)   Received from [facility], [facility]   Social Network    Social Network: Not on file  Intimate Partner Violence: Unknown (05/02/2021)   Received from [facility], [facility]   HITS    Physically Hurt: Not on file    Insult or Talk Down To: Not on file    Threaten Physical Harm: Not on file    Scream or Curse: Not on file    Family History: Family History  Problem Relation Age of Onset   Hypothyroidism Father    Hypertension Father    Migraines Mother    Hypertension Mother    Emphysema Maternal Grandmother    Heart disease Maternal Grandmother    Emphysema Maternal Uncle    Asthma Maternal Uncle    Asthma Paternal Grandmother    Heart disease Paternal Grandfather    Ovarian cancer Other     Review of Systems: Review of Systems  Constitutional:  Negative.   Respiratory: Negative.    Cardiovascular: Negative.   Gastrointestinal: Negative.   Genitourinary: Negative.   Neurological: Negative.     Physical Exam: Vital Signs BP (!) 145/84 (BP Location: Left Arm, Patient Position: Sitting, Cuff Size: Large)   Pulse 88   SpO2 98%   Physical Exam Constitutional:      General: Not in acute distress.  Appearance: Normal appearance. Not ill-appearing.  HENT:     Head: Normocephalic and atraumatic.  Eyes:     Pupils: Pupils are equal, round Neck:     Musculoskeletal: Normal range of motion.  Cardiovascular:     Rate and Rhythm: Normal rate    Pulses: Normal pulses.  Pulmonary:     Effort: Pulmonary effort is normal. No respiratory distress.  Musculoskeletal: Normal range of motion.  Skin:    General: Skin is warm and dry.     Findings: No erythema or rash.  Neurological:     General: No focal deficit present.     Mental Status: Alert and oriented to person, place, and time. Mental status is at baseline.     Motor: No weakness.  Psychiatric:        Mood and Affect: Mood normal.        Behavior: Behavior normal.    Assessment/Plan: The patient is scheduled for bilateral breast reduction with Dr. Ulice Bold.  Risks, benefits, and alternatives of procedure discussed, questions answered and consent obtained.    Smoking Status: Non-smoker; Counseling Given?  N/A Last Mammogram: Patient had a mammogram in January 2024, subsequently needed a diagnostic mammogram and ultrasound.  Most recent ultrasound was 09/09/2022, small focal asymmetry of outer central left breast was less evident on today's exam and no focal ultrasound findings.  These changes are more suggestive of benign process; recommend diagnostic mammogram in 6 months.  Caprini Score: 4, moderate; Risk Factors include: Age, BMI greater than 25 and length of planned surgery. Recommendation for mechanical prophylaxis. Encourage early ambulation.   Pictures obtained:  @consult   Post-op Rx sent to pharmacy: Oxycodone, Zofran, Keflex  Patient was provided with the breast reduction and General Surgical Risk consent document and Pain Medication Agreement prior to their appointment.  They had adequate time to read through the risk consent documents and Pain Medication Agreement. We also discussed them in person together during this preop appointment. All of their questions were answered to their satisfaction.  Recommended calling if they have any further questions.  Risk consent form and Pain Medication Agreement to be scanned into patient's chart.  The risk that can be encountered with breast reduction were discussed and include the following but not limited to these:  Breast asymmetry, fluid accumulation, firmness of the breast, inability to breast feed, loss of nipple or areola, skin loss, decrease or no nipple sensation, fat necrosis of the breast tissue, bleeding, infection, healing delay.  There are risks of anesthesia, changes to skin sensation and injury to nerves or blood vessels.  The muscle can be temporarily or permanently injured.  You may have an allergic reaction to tape, suture, glue, blood products which can result in skin discoloration, swelling, pain, skin lesions, poor healing.  Any of these can lead to the need for revisonal surgery or stage procedures.  A reduction has potential to interfere with diagnostic procedures.  Nipple or breast piercing can increase risks of infection.  This procedure is best done when the breast is fully developed.  Changes in the breast will continue to occur over time.  Pregnancy can alter the outcomes of previous breast reduction surgery, weight gain and weigh loss can also effect the long term appearance.   Recommend holding all supplements prior to surgery  Electronically signed by: Kermit Balo Izyan Ezzell, PA-C 11/14/2022 3:18 PM

## 2022-11-14 NOTE — H&P (View-Only) (Signed)
Patient ID: Nichole Clements, female    DOB: 09/23/1970, 52 y.o.   MRN: [id number]  Chief Complaint  Patient presents with   Pre-op Exam      ICD-10-CM   1. Chronic bilateral thoracic back pain  M54.6    G89.29     2. Symptomatic mammary hypertrophy  N62        History of Present Illness: Nichole Clements is a 52 y.o.  female  with a history of macromastia.  She presents for preoperative evaluation for upcoming procedure, Bilateral Breast Reduction with possible liposuction, scheduled for 12/05/2022 with Dr.  Ulice Bold  The patient has not had problems with anesthesia. No history of DVT/PE.  No family history of DVT/PE.  No family or personal history of bleeding or clotting disorders.  Patient is not currently taking any blood thinners.  No history of CVA/MI.   She does have a history of asthma, well-controlled.  Reports it is seasonal.  She has not had any exacerbations recently.  Summary of Previous Visit: STN 33 cm left, 32 cm right.  BMI is 36.5.  Preoperative bra size is a F or G cup.  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  PMH Significant for: Macromastia, asthma, seasonal allergies  Patient presents today with her husband.  She is feeling well.  No significant changes to her health recently, she has been working on weight loss and diet.  She reports she would like to be about a C/D cup.   She denies any cardiac or pulmonary symptoms.  No cardiac disease.  Past Medical History: Allergies: No Known Allergies  Current Medications:  Current Outpatient Medications:    albuterol (PROVENTIL HFA;VENTOLIN HFA) 108 (90 BASE) MCG/ACT inhaler, Inhale 2 puffs into the lungs every 6 (six) hours as needed for wheezing or shortness of breath., Disp: , Rfl:    atorvastatin (LIPITOR) 20 MG tablet, Take 20 mg by mouth daily., Disp: , Rfl:    buPROPion (WELLBUTRIN XL) 150  MG 24 hr tablet, Take 150 mg by mouth every morning., Disp: , Rfl:    carvedilol (COREG) 6.25 MG tablet, Take 6.25 mg by mouth 2 (two) times daily., Disp: , Rfl:    cephALEXin (KEFLEX) 500 MG capsule, Take 1 capsule (500 mg total) by mouth 4 (four) times daily for 3 days., Disp: 12 capsule, Rfl: 0   cetirizine (ZYRTEC) 10 MG chewable tablet, Chew 10 mg by mouth daily., Disp: , Rfl:    clonazePAM (KLONOPIN) 0.5 MG tablet, Take 0.5 mg by mouth at bedtime., Disp: , Rfl:    fluticasone (FLONASE) 50 MCG/ACT nasal spray, two sprays by Both Nostrils route daily., Disp: , Rfl:    omeprazole (PRILOSEC) 20 MG capsule, Take by mouth., Disp: , Rfl:    ondansetron (ZOFRAN) 4 MG tablet, Take 1 tablet (4 mg total) by mouth every 8 (eight) hours as needed for nausea or vomiting., Disp: 20 tablet, Rfl: 0   oxyCODONE (OXY IR/ROXICODONE) 5 MG immediate release tablet, Take 1 tablet (5 mg total) by mouth every 6 (six) hours as needed for up to 5 days for severe pain (pain score 7-10)., Disp: 20 tablet, Rfl: 0   rizatriptan (MAXALT) 10 MG tablet, Take 10 mg by mouth as needed., Disp: , Rfl:    sertraline (ZOLOFT) 100 MG tablet, Take 100 mg by mouth daily., Disp: , Rfl:  topiramate (TOPAMAX) 100 MG tablet, Take 100 mg by mouth 2 (two) times daily., Disp: , Rfl:    VEOZAH 45 MG TABS, Take 45 mg by mouth daily. Orally once a day for 30 days, Disp: , Rfl:   Past Medical Problems: Past Medical History:  Diagnosis Date   Depression    H/O seasonal allergies    Migraines    Stress incontinence     Past Surgical History: Past Surgical History:  Procedure Laterality Date   HYSTEROSCOPY      Social History: Social History   Socioeconomic History   Marital status: Married    Spouse name: Not on file   Number of children: 2   Years of education: Not on file   Highest education level: Not on file  Occupational History   Occupation: teacher/media specialists  Tobacco Use   Smoking status: Never   Smokeless  tobacco: Not on file   Tobacco comments:    Significant second-hand exposure through her mother primarily.  Substance and Sexual Activity   Alcohol use: Yes    Alcohol/week: 0.0 standard drinks of alcohol    Comment: Once a month.    Drug use: No   Sexual activity: Not on file  Other Topics Concern   Not on file  Social History Narrative   Originally from [facility]. She has always lived in [place]. She has prior travel to [place], [place] on cruise in [place], [place] last in 2012, [place], [place], [place], [place], [place], [place] & [place]. 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 Has a cat & dogs currently. Remote exposure to finches as a child. No known mold exposure in her home or at work. Has an above-ground pool. No hot tub exposure. She reads & watches movies for fun. 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    Social Determinants of Health   Financial Resource Strain: Not on file  Food Insecurity: Not on file  Transportation Needs: Not on file  Physical Activity: Not on file  Stress: Not on file  Social Connections: Unknown (06/10/2021)   Received from [facility], [facility]   Social Network    Social Network: Not on file  Intimate Partner Violence: Unknown (05/02/2021)   Received from [facility], [facility]   HITS    Physically Hurt: Not on file    Insult or Talk Down To: Not on file    Threaten Physical Harm: Not on file    Scream or Curse: Not on file    Family History: Family History  Problem Relation Age of Onset   Hypothyroidism Father    Hypertension Father    Migraines Mother    Hypertension Mother    Emphysema Maternal Grandmother    Heart disease Maternal Grandmother    Emphysema Maternal Uncle    Asthma Maternal Uncle    Asthma Paternal Grandmother    Heart disease Paternal Grandfather    Ovarian cancer Other     Review of Systems: Review of Systems  Constitutional:  Negative.   Respiratory: Negative.    Cardiovascular: Negative.   Gastrointestinal: Negative.   Genitourinary: Negative.   Neurological: Negative.     Physical Exam: Vital Signs BP (!) 145/84 (BP Location: Left Arm, Patient Position: Sitting, Cuff Size: Large)   Pulse 88   SpO2 98%   Physical Exam Constitutional:      General: Not in acute distress.  Appearance: Normal appearance. Not ill-appearing.  HENT:     Head: Normocephalic and atraumatic.  Eyes:     Pupils: Pupils are equal, round Neck:     Musculoskeletal: Normal range of motion.  Cardiovascular:     Rate and Rhythm: Normal rate    Pulses: Normal pulses.  Pulmonary:     Effort: Pulmonary effort is normal. No respiratory distress.  Musculoskeletal: Normal range of motion.  Skin:    General: Skin is warm and dry.     Findings: No erythema or rash.  Neurological:     General: No focal deficit present.     Mental Status: Alert and oriented to person, place, and time. Mental status is at baseline.     Motor: No weakness.  Psychiatric:        Mood and Affect: Mood normal.        Behavior: Behavior normal.    Assessment/Plan: The patient is scheduled for bilateral breast reduction with Dr. Ulice Bold.  Risks, benefits, and alternatives of procedure discussed, questions answered and consent obtained.    Smoking Status: Non-smoker; Counseling Given?  N/A Last Mammogram: Patient had a mammogram in January 2024, subsequently needed a diagnostic mammogram and ultrasound.  Most recent ultrasound was 09/09/2022, small focal asymmetry of outer central left breast was less evident on today's exam and no focal ultrasound findings.  These changes are more suggestive of benign process; recommend diagnostic mammogram in 6 months.  Caprini Score: 4, moderate; Risk Factors include: Age, BMI greater than 25 and length of planned surgery. Recommendation for mechanical prophylaxis. Encourage early ambulation.   Pictures obtained:  @consult   Post-op Rx sent to pharmacy: Oxycodone, Zofran, Keflex  Patient was provided with the breast reduction and General Surgical Risk consent document and Pain Medication Agreement prior to their appointment.  They had adequate time to read through the risk consent documents and Pain Medication Agreement. We also discussed them in person together during this preop appointment. All of their questions were answered to their satisfaction.  Recommended calling if they have any further questions.  Risk consent form and Pain Medication Agreement to be scanned into patient's chart.  The risk that can be encountered with breast reduction were discussed and include the following but not limited to these:  Breast asymmetry, fluid accumulation, firmness of the breast, inability to breast feed, loss of nipple or areola, skin loss, decrease or no nipple sensation, fat necrosis of the breast tissue, bleeding, infection, healing delay.  There are risks of anesthesia, changes to skin sensation and injury to nerves or blood vessels.  The muscle can be temporarily or permanently injured.  You may have an allergic reaction to tape, suture, glue, blood products which can result in skin discoloration, swelling, pain, skin lesions, poor healing.  Any of these can lead to the need for revisonal surgery or stage procedures.  A reduction has potential to interfere with diagnostic procedures.  Nipple or breast piercing can increase risks of infection.  This procedure is best done when the breast is fully developed.  Changes in the breast will continue to occur over time.  Pregnancy can alter the outcomes of previous breast reduction surgery, weight gain and weigh loss can also effect the long term appearance.   Recommend holding all supplements prior to surgery  Electronically signed by: Kermit Balo Izyan Ezzell, PA-C 11/14/2022 3:18 PM

## 2022-11-29 ENCOUNTER — Encounter (HOSPITAL_BASED_OUTPATIENT_CLINIC_OR_DEPARTMENT_OTHER): Payer: Self-pay | Admitting: Plastic Surgery

## 2022-11-29 ENCOUNTER — Other Ambulatory Visit: Payer: Self-pay

## 2022-12-04 NOTE — Anesthesia Preprocedure Evaluation (Signed)
Anesthesia Evaluation  Patient identified by MRN, date of birth, ID band Patient awake    Reviewed: Allergy & Precautions, NPO status , Patient's Chart, lab work & pertinent test results  Airway Mallampati: III  TM Distance: >3 FB Neck ROM: Full    Dental no notable dental hx. (+) Teeth Intact, Dental Advisory Given   Pulmonary asthma    Pulmonary exam normal breath sounds clear to auscultation       Cardiovascular hypertension, Pt. on medications Normal cardiovascular exam Rhythm:Regular Rate:Normal     Neuro/Psych  Headaches PSYCHIATRIC DISORDERS Anxiety        GI/Hepatic Neg liver ROS,GERD  Medicated and Controlled,,  Endo/Other  negative endocrine ROS    Renal/GU      Musculoskeletal negative musculoskeletal ROS (+)    Abdominal   Peds  Hematology negative hematology ROS (+)   Anesthesia Other Findings   Reproductive/Obstetrics                             Anesthesia Physical Anesthesia Plan  ASA: 2  Anesthesia Plan: General   Post-op Pain Management: Tylenol PO (pre-op)* and Precedex   Induction: Intravenous  PONV Risk Score and Plan: 4 or greater and Treatment may vary due to age or medical condition, Scopolamine patch - Pre-op, Midazolam and Ondansetron  Airway Management Planned: Oral ETT  Additional Equipment: None  Intra-op Plan:   Post-operative Plan: Extubation in OR  Informed Consent: I have reviewed the patients History and Physical, chart, labs and discussed the procedure including the risks, benefits and alternatives for the proposed anesthesia with the patient or authorized representative who has indicated his/her understanding and acceptance.     Dental advisory given  Plan Discussed with: CRNA and Anesthesiologist  Anesthesia Plan Comments:         Anesthesia Quick Evaluation

## 2022-12-05 ENCOUNTER — Other Ambulatory Visit: Payer: Self-pay

## 2022-12-05 ENCOUNTER — Encounter (HOSPITAL_BASED_OUTPATIENT_CLINIC_OR_DEPARTMENT_OTHER)
Admission: RE | Disposition: A | Discharge status: Home / Self Care | Payer: Self-pay | Source: Home / Self Care | Attending: Plastic Surgery

## 2022-12-05 ENCOUNTER — Ambulatory Visit (HOSPITAL_BASED_OUTPATIENT_CLINIC_OR_DEPARTMENT_OTHER)
Admission: RE | Admit: 2022-12-05 | Discharge: 2022-12-05 | Disposition: A | Discharge status: Home / Self Care | Payer: BC Managed Care – PPO | Attending: Plastic Surgery | Admitting: Plastic Surgery

## 2022-12-05 ENCOUNTER — Ambulatory Visit (HOSPITAL_BASED_OUTPATIENT_CLINIC_OR_DEPARTMENT_OTHER): Payer: BC Managed Care – PPO | Admitting: Anesthesiology

## 2022-12-05 ENCOUNTER — Encounter (HOSPITAL_BASED_OUTPATIENT_CLINIC_OR_DEPARTMENT_OTHER): Payer: Self-pay | Admitting: Plastic Surgery

## 2022-12-05 DIAGNOSIS — K219 Gastro-esophageal reflux disease without esophagitis: Secondary | ICD-10-CM | POA: Insufficient documentation

## 2022-12-05 DIAGNOSIS — M549 Dorsalgia, unspecified: Secondary | ICD-10-CM | POA: Diagnosis not present

## 2022-12-05 DIAGNOSIS — J45909 Unspecified asthma, uncomplicated: Secondary | ICD-10-CM | POA: Insufficient documentation

## 2022-12-05 DIAGNOSIS — Z01818 Encounter for other preprocedural examination: Secondary | ICD-10-CM

## 2022-12-05 DIAGNOSIS — M542 Cervicalgia: Secondary | ICD-10-CM | POA: Insufficient documentation

## 2022-12-05 DIAGNOSIS — I10 Essential (primary) hypertension: Secondary | ICD-10-CM | POA: Insufficient documentation

## 2022-12-05 DIAGNOSIS — N62 Hypertrophy of breast: Secondary | ICD-10-CM | POA: Insufficient documentation

## 2022-12-05 HISTORY — DX: Anxiety disorder, unspecified: F41.9

## 2022-12-05 HISTORY — DX: Restless legs syndrome: G25.81

## 2022-12-05 HISTORY — PX: BREAST REDUCTION SURGERY: SHX8

## 2022-12-05 HISTORY — DX: Essential (primary) hypertension: I10

## 2022-12-05 HISTORY — DX: Gastro-esophageal reflux disease without esophagitis: K21.9

## 2022-12-05 LAB — POCT PREGNANCY, URINE: Preg Test, Ur: NEGATIVE

## 2022-12-05 SURGERY — MAMMOPLASTY, REDUCTION
Anesthesia: General | Site: Breast | Laterality: Bilateral

## 2022-12-05 MED ORDER — ACETAMINOPHEN 325 MG PO TABS: 650.0000 mg | ORAL_TABLET | ORAL | Status: DC | PRN

## 2022-12-05 MED ORDER — VASHE WOUND IRRIGATION OPTIME
TOPICAL | Status: DC | PRN
  Administered 2022-12-05: 34 [oz_av]

## 2022-12-05 MED ORDER — SCOPOLAMINE 1 MG/3DAYS TD PT72
1.0000 | MEDICATED_PATCH | TRANSDERMAL | Status: DC
  Administered 2022-12-05: 1.5 mg via TRANSDERMAL

## 2022-12-05 MED ORDER — SODIUM CHLORIDE 0.9 % IV SOLN: 250.0000 mL | INTRAVENOUS | Status: DC | PRN

## 2022-12-05 MED ORDER — PROPOFOL 10 MG/ML IV BOLUS
INTRAVENOUS | Status: DC | PRN
  Administered 2022-12-05: 120 mg via INTRAVENOUS

## 2022-12-05 MED ORDER — FENTANYL CITRATE (PF) 100 MCG/2ML IJ SOLN
INTRAMUSCULAR | Status: AC
  Filled 2022-12-05: qty 2

## 2022-12-05 MED ORDER — ACETAMINOPHEN 10 MG/ML IV SOLN: 1000.0000 mg | Freq: Once | INTRAVENOUS | Status: DC | PRN

## 2022-12-05 MED ORDER — ONDANSETRON HCL 4 MG/2ML IJ SOLN: 4.0000 mg | Freq: Once | INTRAMUSCULAR | Status: DC | PRN

## 2022-12-05 MED ORDER — OXYCODONE HCL 5 MG PO TABS
5.0000 mg | ORAL_TABLET | ORAL | Status: DC | PRN
  Administered 2022-12-05: 5 mg via ORAL

## 2022-12-05 MED ORDER — DEXAMETHASONE SODIUM PHOSPHATE 10 MG/ML IJ SOLN
INTRAMUSCULAR | Status: DC | PRN
  Administered 2022-12-05: 10 mg via INTRAVENOUS

## 2022-12-05 MED ORDER — OXYCODONE HCL 5 MG PO TABS
ORAL_TABLET | ORAL | Status: AC
  Filled 2022-12-05: qty 1

## 2022-12-05 MED ORDER — SODIUM CHLORIDE 0.9% FLUSH: 3.0000 mL | INTRAVENOUS | Status: DC | PRN

## 2022-12-05 MED ORDER — OXYCODONE HCL 5 MG/5ML PO SOLN: 5.0000 mg | Freq: Once | ORAL | Status: DC | PRN

## 2022-12-05 MED ORDER — OXYCODONE HCL 5 MG PO TABS: 5.0000 mg | ORAL_TABLET | Freq: Once | ORAL | Status: DC | PRN

## 2022-12-05 MED ORDER — SUGAMMADEX SODIUM 200 MG/2ML IV SOLN
INTRAVENOUS | Status: DC | PRN
  Administered 2022-12-05: 172 mg via INTRAVENOUS

## 2022-12-05 MED ORDER — SODIUM CHLORIDE 0.9 % IV SOLN
INTRAVENOUS | Status: DC | PRN
  Administered 2022-12-05: 40 mL

## 2022-12-05 MED ORDER — MIDAZOLAM HCL 2 MG/2ML IJ SOLN
INTRAMUSCULAR | Status: AC
  Filled 2022-12-05: qty 2

## 2022-12-05 MED ORDER — SUCCINYLCHOLINE CHLORIDE 200 MG/10ML IV SOSY
PREFILLED_SYRINGE | INTRAVENOUS | Status: DC | PRN
  Administered 2022-12-05: 100 mg via INTRAVENOUS

## 2022-12-05 MED ORDER — PROPOFOL 10 MG/ML IV BOLUS
INTRAVENOUS | Status: AC
  Filled 2022-12-05: qty 20

## 2022-12-05 MED ORDER — LIDOCAINE 2% (20 MG/ML) 5 ML SYRINGE
INTRAMUSCULAR | Status: DC | PRN
  Administered 2022-12-05: 100 mg via INTRAVENOUS

## 2022-12-05 MED ORDER — FENTANYL CITRATE (PF) 250 MCG/5ML IJ SOLN
INTRAMUSCULAR | Status: DC | PRN
  Administered 2022-12-05: 100 ug via INTRAVENOUS

## 2022-12-05 MED ORDER — ROCURONIUM BROMIDE 10 MG/ML (PF) SYRINGE
PREFILLED_SYRINGE | INTRAVENOUS | Status: AC
  Filled 2022-12-05: qty 10

## 2022-12-05 MED ORDER — PHENYLEPHRINE 80 MCG/ML (10ML) SYRINGE FOR IV PUSH (FOR BLOOD PRESSURE SUPPORT)
PREFILLED_SYRINGE | INTRAVENOUS | Status: DC | PRN
  Administered 2022-12-05 (×4): 80 ug via INTRAVENOUS

## 2022-12-05 MED ORDER — MIDAZOLAM HCL 2 MG/2ML IJ SOLN
INTRAMUSCULAR | Status: DC | PRN
  Administered 2022-12-05: 2 mg via INTRAVENOUS

## 2022-12-05 MED ORDER — EPHEDRINE SULFATE-NACL 50-0.9 MG/10ML-% IV SOSY
PREFILLED_SYRINGE | INTRAVENOUS | Status: DC | PRN
  Administered 2022-12-05 (×2): 5 mg via INTRAVENOUS

## 2022-12-05 MED ORDER — ACETAMINOPHEN 500 MG PO TABS
1000.0000 mg | ORAL_TABLET | Freq: Once | ORAL | Status: AC
  Administered 2022-12-05: 1000 mg via ORAL

## 2022-12-05 MED ORDER — FENTANYL CITRATE (PF) 100 MCG/2ML IJ SOLN
25.0000 ug | INTRAMUSCULAR | Status: DC | PRN
  Administered 2022-12-05: 50 ug via INTRAVENOUS

## 2022-12-05 MED ORDER — SCOPOLAMINE 1 MG/3DAYS TD PT72
MEDICATED_PATCH | TRANSDERMAL | Status: AC
Start: 2022-12-05 — End: ?
  Filled 2022-12-05: qty 1

## 2022-12-05 MED ORDER — SODIUM CHLORIDE (PF) 0.9 % IJ SOLN: INTRAMUSCULAR | Status: DC | PRN

## 2022-12-05 MED ORDER — AMISULPRIDE (ANTIEMETIC) 5 MG/2ML IV SOLN: 10.0000 mg | Freq: Once | INTRAVENOUS | Status: DC | PRN

## 2022-12-05 MED ORDER — LIDOCAINE-EPINEPHRINE 1 %-1:100000 IJ SOLN
INTRAMUSCULAR | Status: DC | PRN
  Administered 2022-12-05: 50 mL via INTRAMUSCULAR

## 2022-12-05 MED ORDER — SODIUM CHLORIDE 0.9% FLUSH: 3.0000 mL | Freq: Two times a day (BID) | INTRAVENOUS | Status: DC

## 2022-12-05 MED ORDER — ACETAMINOPHEN 325 MG RE SUPP: 650.0000 mg | RECTAL | Status: DC | PRN

## 2022-12-05 MED ORDER — CHLORHEXIDINE GLUCONATE CLOTH 2 % EX PADS: 6.0000 | MEDICATED_PAD | Freq: Once | CUTANEOUS | Status: DC

## 2022-12-05 MED ORDER — ROCURONIUM BROMIDE 10 MG/ML (PF) SYRINGE
PREFILLED_SYRINGE | INTRAVENOUS | Status: DC | PRN
  Administered 2022-12-05: 50 mg via INTRAVENOUS

## 2022-12-05 MED ORDER — ONDANSETRON HCL 4 MG/2ML IJ SOLN
INTRAMUSCULAR | Status: DC | PRN
  Administered 2022-12-05: 4 mg via INTRAVENOUS

## 2022-12-05 MED ORDER — CEFAZOLIN SODIUM-DEXTROSE 2-4 GM/100ML-% IV SOLN
INTRAVENOUS | Status: AC
Start: 2022-12-05 — End: ?
  Filled 2022-12-05: qty 100

## 2022-12-05 MED ORDER — LACTATED RINGERS IV SOLN: INTRAVENOUS | Status: DC

## 2022-12-05 MED ORDER — CEFAZOLIN SODIUM-DEXTROSE 2-4 GM/100ML-% IV SOLN
2.0000 g | INTRAVENOUS | Status: AC
  Administered 2022-12-05: 2 g via INTRAVENOUS

## 2022-12-05 MED ORDER — HYDROMORPHONE HCL 1 MG/ML IJ SOLN: 0.2500 mg | INTRAMUSCULAR | Status: DC | PRN

## 2022-12-05 MED ORDER — ACETAMINOPHEN 500 MG PO TABS
ORAL_TABLET | ORAL | Status: AC
  Filled 2022-12-05: qty 2

## 2022-12-05 SURGICAL SUPPLY — 67 items
ADH SKN CLS APL DERMABOND .7 (GAUZE/BANDAGES/DRESSINGS) ×4
AGENT HMST PWDR BTL CLGN 5GM (Miscellaneous) ×1 IMPLANT
BAG DECANTER FOR FLEXI CONT (MISCELLANEOUS) ×1 IMPLANT
BINDER BREAST LRG (GAUZE/BANDAGES/DRESSINGS) IMPLANT
BINDER BREAST MEDIUM (GAUZE/BANDAGES/DRESSINGS) IMPLANT
BINDER BREAST XLRG (GAUZE/BANDAGES/DRESSINGS) IMPLANT
BINDER BREAST XXLRG (GAUZE/BANDAGES/DRESSINGS) IMPLANT
BIOPATCH RED 1 DISK 7.0 (GAUZE/BANDAGES/DRESSINGS) IMPLANT
BLADE HEX COATED 2.75 (ELECTRODE) IMPLANT
BLADE KNIFE PERSONA 10 (BLADE) ×2 IMPLANT
BLADE SURG 15 STRL LF DISP TIS (BLADE) ×1 IMPLANT
BLADE SURG 15 STRL SS (BLADE) ×1
CANISTER SUCT 1200ML W/VALVE (MISCELLANEOUS) ×1 IMPLANT
COLLAGEN CELLERATERX 5 GRAM (Miscellaneous) IMPLANT
COVER BACK TABLE 60X90IN (DRAPES) ×1 IMPLANT
COVER MAYO STAND STRL (DRAPES) ×1 IMPLANT
DERMABOND ADVANCED .7 DNX12 (GAUZE/BANDAGES/DRESSINGS) ×2 IMPLANT
DRAIN CHANNEL 19F RND (DRAIN) IMPLANT
DRAPE LAPAROSCOPIC ABDOMINAL (DRAPES) ×1 IMPLANT
DRAPE UTILITY XL STRL (DRAPES) ×1 IMPLANT
DRSG MEPILEX POST OP 4X8 (GAUZE/BANDAGES/DRESSINGS) ×2 IMPLANT
ELECT BLADE 4.0 EZ CLEAN MEGAD (MISCELLANEOUS) ×1
ELECT REM PT RETURN 9FT ADLT (ELECTROSURGICAL) ×1
ELECTRODE BLDE 4.0 EZ CLN MEGD (MISCELLANEOUS) ×1 IMPLANT
ELECTRODE REM PT RTRN 9FT ADLT (ELECTROSURGICAL) ×1 IMPLANT
EVACUATOR SILICONE 100CC (DRAIN) IMPLANT
GAUZE PAD ABD 8X10 STRL (GAUZE/BANDAGES/DRESSINGS) ×2 IMPLANT
GLOVE BIO SURGEON STRL SZ 6.5 (GLOVE) ×3 IMPLANT
GLOVE BIO SURGEON STRL SZ7.5 (GLOVE) ×1 IMPLANT
GLOVE BIOGEL PI IND STRL 7.0 (GLOVE) IMPLANT
GLOVE BIOGEL PI IND STRL 8 (GLOVE) IMPLANT
GOWN STRL REUS W/ TWL LRG LVL3 (GOWN DISPOSABLE) ×2 IMPLANT
GOWN STRL REUS W/ TWL XL LVL3 (GOWN DISPOSABLE) ×1 IMPLANT
GOWN STRL REUS W/TWL LRG LVL3 (GOWN DISPOSABLE) ×3
GOWN STRL REUS W/TWL XL LVL3 (GOWN DISPOSABLE) ×2
NDL FILTER BLUNT 18X1 1/2 (NEEDLE) IMPLANT
NDL HYPO 25X1 1.5 SAFETY (NEEDLE) ×2 IMPLANT
NEEDLE FILTER BLUNT 18X1 1/2 (NEEDLE)
NEEDLE HYPO 25X1 1.5 SAFETY (NEEDLE) ×2
NS IRRIG 1000ML POUR BTL (IV SOLUTION) ×1 IMPLANT
PACK BASIN DAY SURGERY FS (CUSTOM PROCEDURE TRAY) ×1 IMPLANT
PAD ALCOHOL SWAB (MISCELLANEOUS) IMPLANT
PAD FOAM SILICONE BACKED (GAUZE/BANDAGES/DRESSINGS) IMPLANT
PENCIL SMOKE EVACUATOR (MISCELLANEOUS) ×1 IMPLANT
PIN SAFETY STERILE (MISCELLANEOUS) IMPLANT
SLEEVE SCD COMPRESS KNEE MED (STOCKING) ×1 IMPLANT
SPIKE FLUID TRANSFER (MISCELLANEOUS) IMPLANT
SPONGE T-LAP 18X18 ~~LOC~~+RFID (SPONGE) ×2 IMPLANT
STRIP SUTURE WOUND CLOSURE 1/2 (MISCELLANEOUS) ×2 IMPLANT
SUT MNCRL AB 4-0 PS2 18 (SUTURE) ×4 IMPLANT
SUT MON AB 3-0 SH 27 (SUTURE) ×8
SUT MON AB 3-0 SH27 (SUTURE) ×4 IMPLANT
SUT MON AB 5-0 PS2 18 (SUTURE) IMPLANT
SUT PDS 3-0 CT2 (SUTURE) ×4
SUT PDS II 3-0 CT2 27 ABS (SUTURE) ×4 IMPLANT
SUT SILK 3 0 PS 1 (SUTURE) IMPLANT
SYR 50ML LL SCALE MARK (SYRINGE) IMPLANT
SYR BULB IRRIG 60ML STRL (SYRINGE) ×1 IMPLANT
SYR CONTROL 10ML LL (SYRINGE) ×2 IMPLANT
TAPE MEASURE VINYL STERILE (MISCELLANEOUS) IMPLANT
TOWEL GREEN STERILE FF (TOWEL DISPOSABLE) ×3 IMPLANT
TRAY DSU PREP LF (CUSTOM PROCEDURE TRAY) ×1 IMPLANT
TUBE CONNECTING 20X1/4 (TUBING) ×1 IMPLANT
TUBING INFILTRATION IT-10001 (TUBING) IMPLANT
TUBING SET GRADUATE ASPIR 12FT (MISCELLANEOUS) IMPLANT
UNDERPAD 30X36 HEAVY ABSORB (UNDERPADS AND DIAPERS) ×2 IMPLANT
YANKAUER SUCT BULB TIP NO VENT (SUCTIONS) ×1 IMPLANT

## 2022-12-05 NOTE — Interval H&P Note (Signed)
History and Physical Interval Note:  12/05/2022 11:57 AM  Nichole Clements  has presented today for surgery, with the diagnosis of Macromastia.  The various methods of treatment have been discussed with the patient and family. After consideration of risks, benefits and other options for treatment, the patient has consented to  Procedure(s): BREAST REDUCTION WITH LIPOSUCTION (Bilateral) as a surgical intervention.  The patient's history has been reviewed, patient examined, no change in status, stable for surgery.  I have reviewed the patient's chart and labs.  Questions were answered to the patient's satisfaction.     Alena Bills Ellayna Hilligoss

## 2022-12-05 NOTE — Anesthesia Postprocedure Evaluation (Signed)
Anesthesia Post Note  Patient: Nichole Clements  Procedure(s) Performed: MAMMARY REDUCTION  (BREAST) (Bilateral: Breast)     Patient location during evaluation: PACU Anesthesia Type: General Level of consciousness: awake and alert Pain management: pain level controlled Vital Signs Assessment: post-procedure vital signs reviewed and stable Respiratory status: spontaneous breathing, nonlabored ventilation, respiratory function stable and patient connected to nasal cannula oxygen Cardiovascular status: blood pressure returned to baseline and stable Postop Assessment: no apparent nausea or vomiting Anesthetic complications: no   There were no known notable events for this encounter.  Last Vitals:  Vitals:   12/05/22 1501 12/05/22 1518  BP: 125/67 (!) 154/84  Pulse: 79 77  Resp: 12 15  Temp:  36.5 C  SpO2: 93% 94%    Last Pain:  Vitals:   12/05/22 1518  TempSrc:   PainSc: 4                  Trevor Iha

## 2022-12-05 NOTE — Op Note (Signed)
Breast Reduction Op note:    DATE OF PROCEDURE: 12/05/2022  LOCATION: [facility]  SURGEON: Foster Simpson, DO  ASSISTANT: Keenan Bachelor, PA  PREOPERATIVE DIAGNOSIS 1. Macromastia 2. Neck Pain 3. Back Pain  POSTOPERATIVE DIAGNOSIS 1. Macromastia 2. Neck Pain 3. Back Pain  PROCEDURES 1. Bilateral breast reduction.  Right reduction 609 g, Left reduction 590 g  COMPLICATIONS: None.  DRAINS: none  INDICATIONS FOR PROCEDURE Nichole Clements is a 52 y.o. year-old female born on [birth year],with a history of symptomatic macromastia with concominant back pain, neck pain, shoulder grooving from her bra.   MRN: [id number]  CONSENT Informed consent was obtained directly from the patient. The risks, benefits and alternatives were fully discussed. Specific risks including but not limited to bleeding, infection, hematoma, seroma, scarring, pain, nipple necrosis, asymmetry, poor cosmetic results, and need for further surgery were discussed. The patient's questions were answered.  DESCRIPTION OF PROCEDURE  Patient was brought into the operating room and rested on the operating room table in the supine position.  SCDs were placed and appropriate padding was performed.  Antibiotics were given. The patient underwent general anesthesia and the chest was prepped and draped in a sterile fashion.  A timeout was performed and all information was confirmed to be correct by those in the room.  Right side: Preoperative markings were confirmed.  Incision lines were injected with local containing epinephrine.  After waiting for vasoconstriction, the marked lines were incised with a #15 blade.  A Wise-pattern superomedial breast reduction was performed by de-epithelializing the pedicle, using bovie to create the superomedial pedicle, and removing breast tissue from the superior, lateral, and inferior portions of the breast.  Care was taken to not undermine the breast pedicle.  Hemostasis was achieved.  The nipple was gently rotated into position and the soft tissue closed with 4-0 Monocryl.   The pocket was irrigated and hemostasis confirmed.  The deep tissues were approximated with 3-0 PDS sutures.  The skin was closed with deep dermal 3-0 Monocryl and subcuticular 4-0 Monocryl sutures.  The nipple and skin flaps had good capillary refill at the end of the procedure.    Left side: Preoperative markings were confirmed.  Incision lines were injected with local containing epinephrine.  After waiting for vasoconstriction, the marked lines were incised with a #15 blade.  A Wise-pattern superomedial breast reduction was performed by de-epithelializing the pedicle, using bovie to create the superomedial pedicle, and removing breast tissue from the superior, lateral, and inferior portions of the breast.  Care was taken to not undermine the breast pedicle. Hemostasis was achieved.  The nipple was gently rotated into position and the soft tissue was closed with 4-0 Monocryl.  The patient was sat upright and size and shape symmetry was confirmed.  The pocket was irrigated and hemostasis confirmed.  The deep tissues were approximated with 3-0 PDS sutures. The skin was closed with deep dermal 3-0 Monocryl and subcuticular 4-0 Monocryl sutures.  Dermabond was applied.  A breast binder and ABDs were placed.  The nipple and skin flaps had good capillary refill at the end of the procedure.  The patient tolerated the procedure well. The patient was allowed to wake from anesthesia and taken to the recovery room in satisfactory condition.  The advanced practice practitioner (APP) assisted throughout the case.  The APP was essential in retraction and counter traction when needed to make the case progress smoothly.  This retraction and assistance made it possible to see the tissue  plans for the procedure.  The assistance was needed for blood control, tissue re-approximation and assisted with closure of  the incision site.

## 2022-12-05 NOTE — Discharge Instructions (Signed)

## 2022-12-05 NOTE — Progress Notes (Signed)
Patient is a pleasant 52 year old female s/p bilateral breast reduction with liposuction performed 12/15/2022 by Dr. Ulice Bold who joins via telephone for postoperative day 1 check-in.  Reviewed operative report and 609 g removed from the right side, 590 g removed from the left side.  No drains placed.  Incisions closed with Monocryl sutures.  Today, patient is doing well from postoperative standpoint.  She states that she slept quite a bit after her surgery yesterday, but has not required any narcotic analgesics today.  She is tolerating p.o. intake well, voiding.  Ambulatory.  No specific complaints.  Denies any leg swelling, chest pain, difficulty breathing, fevers, or asymmetric breast swelling/pain.  Recommend continued activity modifications and compressive garments.  Discussed completion of a form of the patient.  Spoke with another PA who reports that he had already completed her FMLA/STD and it has been submitted.  Patient can call the office should she have any questions or concerns over the weekend, otherwise follow-up next week as scheduled.

## 2022-12-05 NOTE — Transfer of Care (Signed)
Immediate Anesthesia Transfer of Care Note  Patient: Nichole Clements  Procedure(s) Performed: MAMMARY REDUCTION  (BREAST) (Bilateral: Breast)  Patient Location: PACU  Anesthesia Type:General  Level of Consciousness: sedated  Airway & Oxygen Therapy: Patient connected to face mask oxygen  Post-op Assessment: Post -op Vital signs reviewed and stable  Post vital signs: stable  Last Vitals:  Vitals Value Taken Time  BP    Temp    Pulse    Resp    SpO2      Last Pain:  Vitals:   12/05/22 0949  TempSrc: Tympanic  PainSc: 0-No pain      Patients Stated Pain Goal: 7 (12/05/22 0949)  Complications: There were no known notable events for this encounter.

## 2022-12-05 NOTE — Anesthesia Procedure Notes (Signed)
Procedure Name: Intubation Date/Time: 12/05/2022 12:55 PM  Performed by: Hessie Diener, CRNAPre-anesthesia Checklist: Patient identified, Emergency Drugs available, Suction available and Patient being monitored Patient Re-evaluated:Patient Re-evaluated prior to induction Oxygen Delivery Method: Circle System Utilized Preoxygenation: Pre-oxygenation with 100% oxygen Induction Type: IV induction Ventilation: Mask ventilation without difficulty Laryngoscope Size: Mac and 3 Grade View: Grade I Tube type: Oral Tube size: 7.0 mm Number of attempts: 1 Airway Equipment and Method: Stylet and Oral airway Placement Confirmation: ETT inserted through vocal cords under direct vision, positive ETCO2 and breath sounds checked- equal and bilateral Secured at: 21 cm Tube secured with: Tape Dental Injury: Teeth and Oropharynx as per pre-operative assessment

## 2022-12-06 ENCOUNTER — Encounter (HOSPITAL_BASED_OUTPATIENT_CLINIC_OR_DEPARTMENT_OTHER): Payer: Self-pay | Admitting: Plastic Surgery

## 2022-12-06 ENCOUNTER — Ambulatory Visit (INDEPENDENT_AMBULATORY_CARE_PROVIDER_SITE_OTHER): Payer: BC Managed Care – PPO | Admitting: Physician Assistant

## 2022-12-06 DIAGNOSIS — Z9889 Other specified postprocedural states: Secondary | ICD-10-CM

## 2022-12-09 LAB — SURGICAL PATHOLOGY

## 2022-12-13 ENCOUNTER — Ambulatory Visit (INDEPENDENT_AMBULATORY_CARE_PROVIDER_SITE_OTHER): Payer: BC Managed Care – PPO | Admitting: Plastic Surgery

## 2022-12-13 ENCOUNTER — Encounter: Payer: Self-pay | Admitting: Plastic Surgery

## 2022-12-13 ENCOUNTER — Telehealth: Payer: Self-pay

## 2022-12-13 DIAGNOSIS — N62 Hypertrophy of breast: Secondary | ICD-10-CM

## 2022-12-13 NOTE — Telephone Encounter (Signed)
Verified with Dr. Ulice Bold, patient can get the 2nd round of the shingle shot the first week of December.  Called patient and advised. Patient agreed and understood.

## 2022-12-13 NOTE — Progress Notes (Signed)
The patient is a 52 year old female here for follow-up after undergoing bilateral breast reduction on November 7.  She had over 550 g removed from both breasts.  Overall she is doing extremely well.  There is no sign of a hematoma or infection.  Swelling and bruising is as expected.  Pain is well-controlled.  Continue with sports bra and follow-up in 2 weeks.

## 2022-12-24 ENCOUNTER — Ambulatory Visit: Payer: BC Managed Care – PPO | Admitting: Surgical

## 2022-12-24 DIAGNOSIS — N62 Hypertrophy of breast: Secondary | ICD-10-CM

## 2022-12-24 DIAGNOSIS — M546 Pain in thoracic spine: Secondary | ICD-10-CM

## 2022-12-24 DIAGNOSIS — G8929 Other chronic pain: Secondary | ICD-10-CM

## 2022-12-24 DIAGNOSIS — Z9889 Other specified postprocedural states: Secondary | ICD-10-CM

## 2022-12-24 NOTE — Progress Notes (Signed)
Patient is a 52 year old female here for follow-up after bilateral breast reduction with Dr. Ulice Bold on 12/05/2022.  She is just shy of 3 weeks postop.  She reports she is overall doing really well, she is here with her husband.  She reports that she has been noticing some itching on the lateral breasts.  She also has some sutures that are bothering her.  Chaperone present on exam Bilateral NAC's are viable, bilateral breast incisions are intact. There is no erythema or cellulitic changes noted. No subcutaneous fluid collections noted with palpation. Steri-Strips are still in place, these were removed to reveal well-healing incisions.  A/P:  Patient is healing well after breast reduction surgery, no signs of infection or concern on exam. Suture knots were removed, patient tolerated this well.  Recommend continuing with compressive garment 24/7 until 6 weeks post-op,  avoiding strenuous activity/heavy lifting until 6 weeks post-op  Recommend following up in 2-3 weeks for final f/u All the patient's questions were answered to their content. Recommend calling with any questions or concerns.

## 2023-01-07 ENCOUNTER — Ambulatory Visit (INDEPENDENT_AMBULATORY_CARE_PROVIDER_SITE_OTHER): Payer: BC Managed Care – PPO | Admitting: Surgical

## 2023-01-07 VITALS — BP 116/82 | HR 72

## 2023-01-07 DIAGNOSIS — M546 Pain in thoracic spine: Secondary | ICD-10-CM

## 2023-01-07 DIAGNOSIS — G8929 Other chronic pain: Secondary | ICD-10-CM

## 2023-01-07 DIAGNOSIS — N62 Hypertrophy of breast: Secondary | ICD-10-CM

## 2023-01-07 DIAGNOSIS — Z9889 Other specified postprocedural states: Secondary | ICD-10-CM

## 2023-01-07 NOTE — Progress Notes (Signed)
52 year old female here for follow-up after bilateral breast reduction with Dr. Ulice Bold on 12/05/2022. She is 5 weeks postop. She reports overall she is doing really well, she does still have some tenderness where liposuction was performed.  She has some questions about sutures that are still present.  Chaperone present on exam Bilateral NAC's are viable, bilateral breast incisions are intact. There is no erythema or cellulitic changes noted. No subcutaneous fluid collections noted with palpation.  She does have a few sutures protruding through the skin which were removed today.  Recommend continuing with compressive garment 24/7 until 6 weeks post-op,  avoiding strenuous activity/heavy lifting until 6 weeks post-op We discussed 1 more week of restrictions.  We discussed avoiding submerging the incisions in water until they completely heal.  Recommend following up as needed.  Discussed the importance of scar cream to help with scar healing. All the patient's questions were answered to their content. Recommend calling with any questions or concerns.  Pictures were obtained of the patient and placed in the chart with the patient's or guardian's permission.

## 2023-01-15 ENCOUNTER — Encounter: Payer: Self-pay | Admitting: Plastic Surgery

## 2023-04-29 ENCOUNTER — Encounter: Payer: Self-pay | Admitting: Plastic Surgery
